# Patient Record
Sex: Male | Born: 1980
Health system: Southern US, Community
[De-identification: ages and names within clinical notes are randomized; demographics above are authoritative.]

## PROBLEM LIST (undated history)

## (undated) DIAGNOSIS — K219 Gastro-esophageal reflux disease without esophagitis: Secondary | ICD-10-CM

## (undated) DIAGNOSIS — R001 Bradycardia, unspecified: Secondary | ICD-10-CM

## (undated) HISTORY — DX: Gastro-esophageal reflux disease without esophagitis: K21.9

## (undated) HISTORY — PX: NO PAST SURGERIES: SHX2092

---

## 2008-08-16 ENCOUNTER — Ambulatory Visit: Payer: Self-pay | Admitting: Family Medicine

## 2008-08-16 DIAGNOSIS — J309 Allergic rhinitis, unspecified: Secondary | ICD-10-CM | POA: Insufficient documentation

## 2008-08-16 LAB — CONVERTED CEMR LAB
ALT: 20 units/L (ref 0–53)
AST: 25 units/L (ref 0–37)
Albumin: 4.6 g/dL (ref 3.5–5.2)
BUN: 9 mg/dL (ref 6–23)
Basophils Relative: 0.7 % (ref 0.0–3.0)
Chloride: 103 meq/L (ref 96–112)
Creatinine, Ser: 1 mg/dL (ref 0.4–1.5)
Eosinophils Absolute: 0.2 10*3/uL (ref 0.0–0.7)
Eosinophils Relative: 3.4 % (ref 0.0–5.0)
GFR calc non Af Amer: 95 mL/min
Glucose, Bld: 100 mg/dL — ABNORMAL HIGH (ref 70–99)
HCT: 46 % (ref 39.0–52.0)
MCV: 95.3 fL (ref 78.0–100.0)
Neutrophils Relative %: 49.6 % (ref 43.0–77.0)
RBC: 4.82 M/uL (ref 4.22–5.81)
TSH: 2 microintl units/mL (ref 0.35–5.50)
Total Protein: 7.5 g/dL (ref 6.0–8.3)
VLDL: 24 mg/dL (ref 0–40)
WBC: 6.1 10*3/uL (ref 4.5–10.5)

## 2008-08-17 ENCOUNTER — Ambulatory Visit: Payer: Self-pay | Admitting: Family Medicine

## 2008-08-17 ENCOUNTER — Telehealth (INDEPENDENT_AMBULATORY_CARE_PROVIDER_SITE_OTHER): Payer: Self-pay | Admitting: *Deleted

## 2008-08-24 ENCOUNTER — Telehealth (INDEPENDENT_AMBULATORY_CARE_PROVIDER_SITE_OTHER): Payer: Self-pay | Admitting: *Deleted

## 2008-08-24 ENCOUNTER — Encounter (INDEPENDENT_AMBULATORY_CARE_PROVIDER_SITE_OTHER): Payer: Self-pay | Admitting: *Deleted

## 2009-01-24 ENCOUNTER — Ambulatory Visit: Payer: Self-pay | Admitting: Family Medicine

## 2009-01-28 ENCOUNTER — Encounter (INDEPENDENT_AMBULATORY_CARE_PROVIDER_SITE_OTHER): Payer: Self-pay | Admitting: *Deleted

## 2009-02-11 ENCOUNTER — Encounter: Payer: Self-pay | Admitting: Internal Medicine

## 2009-02-11 ENCOUNTER — Ambulatory Visit: Payer: Self-pay | Admitting: Family Medicine

## 2009-02-11 DIAGNOSIS — R5381 Other malaise: Secondary | ICD-10-CM | POA: Insufficient documentation

## 2009-02-11 DIAGNOSIS — I498 Other specified cardiac arrhythmias: Secondary | ICD-10-CM | POA: Insufficient documentation

## 2009-02-11 DIAGNOSIS — R5383 Other fatigue: Secondary | ICD-10-CM

## 2009-02-12 ENCOUNTER — Encounter (INDEPENDENT_AMBULATORY_CARE_PROVIDER_SITE_OTHER): Payer: Self-pay | Admitting: *Deleted

## 2009-02-12 LAB — CONVERTED CEMR LAB
BUN: 12 mg/dL (ref 6–23)
Basophils Absolute: 0 10*3/uL (ref 0.0–0.1)
Calcium: 9 mg/dL (ref 8.4–10.5)
Creatinine, Ser: 0.8 mg/dL (ref 0.4–1.5)
Eosinophils Absolute: 0.2 10*3/uL (ref 0.0–0.7)
GFR calc non Af Amer: 121.82 mL/min (ref >60)
Glucose, Bld: 91 mg/dL (ref 70–99)
Lymphocytes Relative: 44.2 % (ref 12.0–46.0)
MCHC: 35.1 g/dL (ref 30.0–36.0)
Monocytes Relative: 11.3 % (ref 3.0–12.0)
Neutrophils Relative %: 41.3 % — ABNORMAL LOW (ref 43.0–77.0)
Platelets: 240 10*3/uL (ref 150.0–400.0)
RDW: 11.5 % (ref 11.5–14.6)
Sodium: 144 meq/L (ref 135–145)
TSH: 2.61 microintl units/mL (ref 0.35–5.50)

## 2009-03-01 ENCOUNTER — Ambulatory Visit: Payer: Self-pay | Admitting: Internal Medicine

## 2009-03-06 ENCOUNTER — Ambulatory Visit: Payer: Self-pay

## 2009-03-06 ENCOUNTER — Ambulatory Visit: Payer: Self-pay | Admitting: Internal Medicine

## 2010-06-05 ENCOUNTER — Ambulatory Visit: Payer: Self-pay | Admitting: Family Medicine

## 2010-06-05 DIAGNOSIS — R109 Unspecified abdominal pain: Secondary | ICD-10-CM | POA: Insufficient documentation

## 2010-06-05 LAB — CONVERTED CEMR LAB
Hep B C IgM: NEGATIVE
Hep B Core Total Ab: POSITIVE — AB

## 2010-06-06 ENCOUNTER — Encounter: Payer: Self-pay | Admitting: Family Medicine

## 2010-06-09 ENCOUNTER — Telehealth (INDEPENDENT_AMBULATORY_CARE_PROVIDER_SITE_OTHER): Payer: Self-pay | Admitting: *Deleted

## 2010-06-09 LAB — CONVERTED CEMR LAB
ALT: 21 units/L (ref 0–53)
BUN: 13 mg/dL (ref 6–23)
Basophils Absolute: 0 10*3/uL (ref 0.0–0.1)
Bilirubin, Direct: 0.2 mg/dL (ref 0.0–0.3)
Chloride: 105 meq/L (ref 96–112)
Creatinine, Ser: 0.8 mg/dL (ref 0.4–1.5)
Eosinophils Relative: 0.9 % (ref 0.0–5.0)
GFR calc non Af Amer: 122.48 mL/min (ref >60)
Glucose, Bld: 126 mg/dL — ABNORMAL HIGH (ref 70–99)
H Pylori IgG: POSITIVE
HCT: 43.7 % (ref 39.0–52.0)
Lymphs Abs: 1.3 10*3/uL (ref 0.7–4.0)
MCV: 92.9 fL (ref 78.0–100.0)
Monocytes Absolute: 0.3 10*3/uL (ref 0.1–1.0)
Neutrophils Relative %: 31.5 % — ABNORMAL LOW (ref 43.0–77.0)
Platelets: 284 10*3/uL (ref 150.0–400.0)
RDW: 12.3 % (ref 11.5–14.6)
Total Bilirubin: 0.9 mg/dL (ref 0.3–1.2)
WBC: 2.5 10*3/uL — ABNORMAL LOW (ref 4.5–10.5)

## 2010-06-16 DIAGNOSIS — D709 Neutropenia, unspecified: Secondary | ICD-10-CM | POA: Insufficient documentation

## 2010-06-16 DIAGNOSIS — K921 Melena: Secondary | ICD-10-CM | POA: Insufficient documentation

## 2010-06-17 ENCOUNTER — Ambulatory Visit: Payer: Self-pay | Admitting: Family Medicine

## 2010-06-17 LAB — CONVERTED CEMR LAB
Basophils Absolute: 0 10*3/uL (ref 0.0–0.1)
Eosinophils Relative: 2.5 % (ref 0.0–5.0)
Monocytes Absolute: 0.7 10*3/uL (ref 0.1–1.0)
Monocytes Relative: 15.1 % — ABNORMAL HIGH (ref 3.0–12.0)
Neutrophils Relative %: 41.3 % — ABNORMAL LOW (ref 43.0–77.0)
Platelets: 237 10*3/uL (ref 150.0–400.0)
RDW: 12.5 % (ref 11.5–14.6)
WBC: 4.9 10*3/uL (ref 4.5–10.5)

## 2010-07-22 ENCOUNTER — Telehealth (INDEPENDENT_AMBULATORY_CARE_PROVIDER_SITE_OTHER): Payer: Self-pay | Admitting: *Deleted

## 2010-08-05 NOTE — Assessment & Plan Note (Signed)
Summary: FOR STOMACH PROBLEM//PH   Vital Signs:  Patient profile:   30 year old male Height:      61.50 inches Weight:      121 pounds BMI:     22.57 Temp:     98.8 degrees F oral Pulse rate:   64 / minute BP sitting:   112 / 70  (left arm)  Vitals Entered By: Doristine Devoid CMA (June 05, 2010 2:53 PM) CC: stomach cramping mostly in am hx of tapeworm    History of Present Illness: 30 yo man here today for abd cramping.  sxs are intermittant.  hx of tapeworm in the past (when living in [place])- pt feels this is similar to previous.  no diarrhea.  no vomiting.  sxs have been present for 'years'.  sxs are not worsening.  no pain currently.  no weight loss.  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 Lungs are clear to auscultation, no crackles or wheezes. Heart:  Normal rate and regular rhythm. S1 and S2 normal without gallop, murmur, click, rub or other extra sounds. Abdomen:  Bowel sounds positive,abdomen soft and non-tender without masses, organomegaly or hernias noted.   Impression & Recommendations:  Problem # 1:  ABDOMINAL PAIN (ICD-789.00) Assessment New again difficult hx to obtain given language barrier.  pt and girlfriend are most worried about recurrent tapeworm and hepatitis status.  pt has no idea if he has been vaccinated or not.  will check labs.  advised H2 blocker for sxs relief- if no improvement will change to PPI. Orders: Venipuncture (45409) TLB-BMP (Basic Metabolic Panel-BMET)  (80048-METABOL) TLB-CBC Platelet - w/Differential (85025-CBCD) TLB-Hepatic/Liver Function Pnl (80076-HEPATIC) TLB-H. Pylori Abs(Helicobacter Pylori) (86677-HELICO) T-Hepatitis B Core Antibody, IGM (81191-47829) T-Hepatitis B Core Antibody (56213-08657) T-Hepatitis B Surface Antigen (84696-29528) T-Hepatitis B Surface Antibody (41324-40102) T-Stool for O&P (72536-64403) T-Culture, Stool (87045/87046-70140) Specimen Handling (47425)  Patient Instructions: 1)  We'll notify you of your lab results 2)  Please collect the stool sample and return it as directed 3)  Make sure you are eating regularly to avoid your stomach being empty 4)  Start Zantac to decrease amount of stomach acid 5)  Call with any questions or concerns 6)  Hang in there!   Orders Added: 1)  Venipuncture [36415] 2)  TLB-BMP (Basic Metabolic Panel-BMET) [80048-METABOL] 3)  TLB-CBC Platelet - w/Differential [85025-CBCD] 4)  TLB-Hepatic/Liver Function Pnl [80076-HEPATIC] 5)  TLB-H. Pylori Abs(Helicobacter Pylori) [86677-HELICO] 6)  T-Hepatitis B Core Antibody, IGM [86705-23580] 7)  T-Hepatitis B Core Antibody [95638-75643] 8)  T-Hepatitis B Surface Antigen [32951-88416] 9)  T-Hepatitis B Surface Antibody [60630-16010] 10)  T-Stool for O&P [87177-70555] 11)  T-Culture, Stool [87045/87046-70140] 12)  Specimen Handling [99000] 13)  Est. Patient Level III [93235]

## 2010-08-05 NOTE — Progress Notes (Signed)
Summary: labs   Phone Note Outgoing Call   Call placed by: Doristine Devoid CMA,  June 09, 2010 3:14 PM Call placed to: Patient Summary of Call: H pylori +.  needs to start bismuth subsalicylate 525mg  three times a day + metronidazole 500mg  three times a day + tetracycline 500mg  three times a day + omeprazole 40mg  daily x 14 days.  also has low WBC.  needs repeat CBC in 1 week- dx neutropenia.   Follow-up for Phone Call        spoke w/ Thu patient contact informed of labs and treatment information will call back to scheduled labs in 1 week..........Marland KitchenDoristine Devoid CMA  June 09, 2010 3:15 PM     New/Updated Medications: METRONIDAZOLE 500 MG TABS (METRONIDAZOLE) take one tablet three times a day x14 days TETRACYCLINE HCL 500 MG CAPS (TETRACYCLINE HCL) take one tablet three times a day x14 days OMEPRAZOLE 40 MG CPDR (OMEPRAZOLE) take one tablet daily x14 days Prescriptions: OMEPRAZOLE 40 MG CPDR (OMEPRAZOLE) take one tablet daily x14 days  #14 x 0   Entered by:   Doristine Devoid CMA   Authorized by:   Neena Rhymes MD   Signed by:   Doristine Devoid CMA on 06/09/2010   Method used:   Electronically to        [id number] (retail)       [address]       [place], [place]  [address]       Ph: [phone] or [phone]       Fax: [phone]   RxID:   [id number] TETRACYCLINE HCL 500 MG CAPS (TETRACYCLINE HCL) take one tablet three times a day x14 days  #42 x 0   Entered by:   Doristine Devoid CMA   Authorized by:   Neena Rhymes MD   Signed by:   Doristine Devoid CMA on 06/09/2010   Method used:   Electronically to        [id number]* (retail)       [address]       [place], [place]  [address]       Ph: [phone] or [phone]       Fax: [phone]   RxID:   [id number] METRONIDAZOLE 500 MG TABS (METRONIDAZOLE) take one tablet three times a day x14 days  #42 x 0   Entered by:    Doristine Devoid CMA   Authorized by:   Neena Rhymes MD   Signed by:   Doristine Devoid CMA on 06/09/2010   Method used:   Electronically to        [id number] (retail)       [address]       [place], [place]  [address]       Ph: [phone] or [phone]       Fax: [phone]   RxID:   [id number]

## 2010-08-07 NOTE — Assessment & Plan Note (Signed)
Summary: blood in stool, weightloss, LABS:CBC//FD   Vital Signs:  Patient profile:   30 year old male Weight:      121 pounds BMI:     22.57 Temp:     97.8 degrees F oral BP sitting:   100 / 70  (left arm)  Vitals Entered By: Doristine Devoid CMA (June 16, 2010 2:11 PM) CC: review labs had blood in stool    History of Present Illness: 30 yo man here today for  1) BRBPR- pt reports intermitant sxs for 'a few years'.  last occurred 2-3 months ago.  there was no straining involved.  no hx of constipation.  pt told girlfriend that he thought he had been dx'd w/ internal hemorrhoid previously.  no pain w/ bleeding.  2) hepatitis- discussed pt's immunity due to prior infxn.  3) Neutropenia- WBC of 2.5 is much lower than previous value in the 6s.  will repeat CBC.  Current Medications (verified): 1)  Metronidazole 500 Mg Tabs (Metronidazole) .... Take One Tablet Three Times A Day X14 Days 2)  Tetracycline Hcl 500 Mg Caps (Tetracycline Hcl) .... Take One Tablet Three Times A Day X14 Days 3)  Omeprazole 40 Mg Cpdr (Omeprazole) .... Take One Tablet Daily X14 Days  Allergies (verified): No Known Drug Allergies  Past History:  Past medical, surgical, family and social histories (including risk factors) reviewed, and no changes noted (except as noted below).  Past Medical History: + TB test Fatigue Allergic rhinitis Hep B immunity from previous infxn  Past Surgical History: Reviewed history from 03/01/2009 and no changes required. wisdom teeth extraction  Family History: Reviewed history from 08/16/2008 and no changes required. CAD-no HTN-no DM-no STROKE-no COLON CA-no PROSTATE CA-no  Social History: Reviewed history from 06/05/2010 and no changes required. 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 No apparent alopecia or balding.  Neck:  No deformities, masses, or tenderness noted. Abdomen:  Bowel sounds positive,abdomen soft and non-tender without masses, organomegaly or hernias noted. Rectal:  pt declined Skin:  Intact without suspicious lesions or rashes Cervical Nodes:  No lymphadenopathy noted Psych:  anxious   Impression & Recommendations:  Problem # 1:  BLOOD IN STOOL (ICD-578.1) Assessment New will refer to GI but pt needs to check w/ insurance prior to refferal to make sure that GI is preferred provider.  pt declined rectal exam today.  Problem # 2:  NEUTROPENIA UNSPECIFIED (ICD-288.00) Assessment: New recheck CBC.  if remains low will refer to hematology Orders: Venipuncture (62130) Specimen Handling (86578) TLB-CBC Platelet - w/Differential (85025-CBCD)  Problem # 3:  HEPATITIS B IMMUNITY (ICD-V05.8) Assessment: New discussed pt's hep b panel at length to make sure pt and girlfriend understood that he does have immunity from prior infxn.  Complete Medication List: 1)  Metronidazole 500 Mg Tabs (Metronidazole) .... Take one tablet three times a day x14 days 2)  Tetracycline Hcl 500 Mg Caps (Tetracycline hcl) .... Take one tablet three times a day x14 days 3)  Omeprazole 40 Mg Cpdr (Omeprazole) .... Take one tablet daily x14 days  Patient Instructions: 1)  Please call and let us know where you want to go to GI for the blood  in the stool 2)  We'll notify you of your lab results 3)  You are immune to hepatitis due to past infection 4)  Call with any questions or concerns 5)  Happy Holidays!   Orders Added: 1)  Venipuncture [84132] 2)  Specimen Handling [99000] 3)  TLB-CBC Platelet - w/Differential [85025-CBCD] 4)  Est. Patient Level IV [44010]

## 2010-08-07 NOTE — Progress Notes (Signed)
Summary: referral  Phone Note Call from Patient Call back at [phone]   Summary of Call: Pt will be seeing Dr. Theron Arista @ [facility]. Pt appt 08-04-10. orders need to be seen to fax [phone]...Marland KitchenMarland KitchenFelecia Deloach CMA  July 22, 2010 3:37 PM   Left detail message OV notes and labs faxed to Dr Theron Arista @[email or web address]............Marland KitchenFelecia Deloach CMA  July 22, 2010 3:44 PM

## 2012-03-10 ENCOUNTER — Ambulatory Visit: Payer: Self-pay | Admitting: Family Medicine

## 2014-05-24 ENCOUNTER — Encounter: Payer: Self-pay | Admitting: Family Medicine

## 2014-05-24 ENCOUNTER — Ambulatory Visit (INDEPENDENT_AMBULATORY_CARE_PROVIDER_SITE_OTHER): Payer: BC Managed Care – PPO | Admitting: Family Medicine

## 2014-05-24 VITALS — BP 120/80 | HR 57 | Temp 97.9°F | Resp 16 | Wt 117.0 lb

## 2014-05-24 DIAGNOSIS — M5412 Radiculopathy, cervical region: Secondary | ICD-10-CM | POA: Insufficient documentation

## 2014-05-24 MED ORDER — PREDNISONE 10 MG PO TABS: ORAL_TABLET | ORAL | Status: DC

## 2014-05-24 MED ORDER — CYCLOBENZAPRINE HCL 10 MG PO TABS: 10.0000 mg | ORAL_TABLET | Freq: Three times a day (TID) | ORAL | Status: DC | PRN

## 2014-05-24 NOTE — Progress Notes (Signed)
   Subjective:    Patient ID: Paul Cabrera, male    DOB: 10/13/1980, 33 y.o.   MRN: [id number]  HPI Neck pain- pt was lifting something heavy ~4 weeks ago.  Marthe PatchHeard 'pop'.  Now having neck pain, thoracic back pain radiating into lumbar.  Pain will radiate into both shoulders and intermittent numbness of both hands.  Taking Advil w/o relief.   Review of Systems For ROS see HPI     Objective:   Physical Exam  Constitutional: He is oriented to person, place, and time. He appears well-developed and well-nourished. No distress.  HENT:  Head: Normocephalic and atraumatic.  Neck: Normal range of motion. Neck supple.  + trap spasm bilaterally  Musculoskeletal:  Full ROM of shoulders bilaterally  Neurological: He is alert and oriented to person, place, and time. He has normal reflexes. No cranial nerve deficit. Coordination normal.  Skin: Skin is warm and dry.  Psychiatric: He has a normal mood and affect. His behavior is normal. Thought content normal.  Vitals reviewed.         Assessment & Plan:

## 2014-05-24 NOTE — Progress Notes (Signed)
Pre visit review using our clinic review tool, if applicable. No additional management support is needed unless otherwise documented below in the visit note. 

## 2014-05-24 NOTE — Patient Instructions (Signed)
Follow up as needed Start the Prednisone as directed- take w/ food- for inflammation Use the flexeril nightly for muscle spasm HEAT!! If no improvement in 10 days, call me so we can refer to ortho for evaluation Call with any questions or concerns Hang in there!!!

## 2014-05-27 NOTE — Assessment & Plan Note (Signed)
New.  Pt's sxs and PE consistent w/ muscle spasm causing radicular pain.  He may have cervical disc injury/disease but no TTP over cervical spine.  Start pred taper.  Flexeril prn spasms.  If no improvement, will need ortho referral.  Reviewed supportive care and red flags that should prompt return.  Pt expressed understanding and is in agreement w/ plan.

## 2014-08-11 ENCOUNTER — Ambulatory Visit (INDEPENDENT_AMBULATORY_CARE_PROVIDER_SITE_OTHER): Payer: BLUE CROSS/BLUE SHIELD | Admitting: Internal Medicine

## 2014-08-11 VITALS — BP 100/60 | HR 46 | Temp 98.6°F | Resp 16 | Ht 63.0 in | Wt 122.0 lb

## 2014-08-11 DIAGNOSIS — L259 Unspecified contact dermatitis, unspecified cause: Secondary | ICD-10-CM

## 2014-08-11 MED ORDER — PREDNISONE 10 MG PO TABS: ORAL_TABLET | ORAL | Status: DC

## 2014-08-11 NOTE — Patient Instructions (Signed)
Vim Da Ti?p Xc (Contact Dermatitis) Vim da ti?p xc l ph?n ?ng v?i cc ch?t nh?t ??nh ti?p xc v?i da. Vim da ti?p xc c th? l vim da do ti?p xc v?i ch?t gy kch thch ho?c vim da ti?p xc do d? ?ng. Vim da do ti?p xc v?i ch?t gy kch thch khng c?n ti?p xc tr??c ? v?i ch?t gy ra ph?n ?ng. Vim da ti?p xc do d? ?ng ch? x?y ra n?u b?n ? ti?p xc v?i ch?t ? tr??c ?y. Khi ti?p xc l?p l?i, c? th? c?a b?n s? ph?n ?ng v?i ch?t ?. NGUYN NHN Nhi?u ch?t c th? gy ra vim da ti?p xc. Vim da do ch?t kch thch l nguyn nhn ph? bi?n nh?t do ti?p xc l?p ?i l?p l?i v?i ch?t kch thch nh?, ch?ng h?n nh?:  Trang ?i?m.  X phng.  Ch?t t?y r?a.  Ch?t t?y tr?ng.  Axit.  Mu?i kim lo?i, ch?ng h?n nh? niken. Vim da ti?p xc do d? ?ng ph? bi?n nh?t l do ti?p xc v?i:  Th?c v?t ??c.  Ha ch?t (ch?t kh? mi, d?u g?i).  ?? trang s?c.  Bao cao su.  Neomycin trong kem khng sinh ba tc d?ng.  Ch?t b?o qu?n trong cc s?n ph?m, bao g?m c? qu?n o. TRI?U CH?NG Vng da b? ti?p xc c th? pht tri?n:  Kh ho?c bong v?y.  ??.  N?t.  Ng?a.  ?au ho?c c?m gic b?ng rt.  M?n n??c. Khi b? vim da ti?p xc do d? ?ng, c?ng c th? b? s?ng trong nh?ng vng nh? m m?t, mi?ng ho?c b? ph?n sinh d?c. CH?N ?ON Chuyn gia ch?m sc s?c kh?e c?a b?n th??ng c th? xc ??nh v?n ?? b?ng cch khm th?c th?. Trong tr??ng h?p khng ch?c ch?n nguyn nhn v nghi ng? b?nh vim da ti?p xc do d? ?ng, xt nghi?m mi?ng da c th? ???c th?c hi?n ?? gip xc ??nh nguyn nhn vim da. ?I?U TR? ?i?u tr? bao g?m b?o v? da kh?i ti?p xc thm v?i ch?t kch thch b?ng cch trnh cc ch?t ? n?u c th?. B?t, kem ch?n v g?ng tay c th? h?u ch. Chuyn gia ch?m sc s?c kh?e c?a b?n c?ng c th? ?? ngh?:  Thoa kem ho?c thu?c m? steroid 2 l?n m?i ngy. ?? c k?t qu? t?t nh?t, hy ngm vng pht ban vo n??c l?nh trong 20 pht. Sau ? thoa thu?c. Che vng ny b?ng mi?ng nh?a. B?n c th? b?o qu?n kem  steroid trong t? l?nh ?? c hi?u ?ng "l?nh" cho ch? pht ban c?a b?n. Cch ? c th? lm gi?m ng?a. Thu?c steroid dng ?? u?ng c th? c?n thi?t trong tr??ng h?p n?ng.  Thu?c khng sinh ho?c thu?c m? khng khu?n n?u xu?t hi?n nhi?m trng da.  Dung d?ch thu?c khng histamin ho?c thu?c khng histamin d?ng u?ng ?? gi?m b?t ng?a.  D?u ?? gi? ?? ?m trong da.  Dung d?ch burow ?? gi?m t?y ?? v ?au nh?c ho?c ?? lm kh ban ?ang r? n??c. Tr?n m?t gi ho?c vin dung d?ch trong 2 chn n??c l?nh. Nhng m?t chi?c kh?n s?ch vo h?n h?p, v?t m?t cht v ??t n ln vng b? ?nh h??ng. ?? kh?n t?i ch? trong 30 pht. Lm ?i?u ny cng nhi?u l?n cng t?t trong su?t c? ngy.  T?m b?t b?p ho?c so?a bicacbonat hng ngy n?u vng ny l qu l?n ?? che b?ng kh?n m?t. Ha ch?t m?nh,   ch?ng h?n nh? ch?t ki?m ho?c axit, c th? gy t?n th??ng da gi?ng nh? b?ng. B?n c?n x? n??c vo da mnh t? 15 ??n 20 pht b?ng n??c l?nh sau khi ti?p xc. B?n c?ng nn ?i khm ngay l?p t?c sau khi ti?p xc. B?ng, thu?c khng sinh v thu?c gi?m ?au c th? c?n thi?t cho da b? kch thch n?ng. H??NG D?N CH?M SC T?I NH  Trnh ch?t gy ra ph?n ?ng.  Gi? cho vng da b? ?nh h??ng trnh xa n??c nng, x phng, nh sng m?t tr?i, ha ch?t, cc ch?t c tnh axit ho?c b?t c? th? g khc c th? gy kch ?ng da c?a b?n.  Khng gi ch? pht ban. Gi c th? khi?n cho ch? pht ban b? nhi?m trng.  B?n c th? t?m n??c mt ?? gip gi?m ng?a.  Ch? s? d?ng thu?c khng c?n k toa ho?c thu?c c?n k toa theo ch? d?n c?a chuyn gia ch?m sc s?c kh?e.  G?p chuyn gia ch?m sc s?c kh?e c?a b?n ?? khm l?i theo ch? d?n ?? ??m b?o da c?a b?n li?n ?ng cch. HY ?I KHM N?U:  Tnh tr?ng c?a b?n khng c?i thi?n sau 3 ngy ?i?u tr?.  B?n d??ng nh? tr? nn t? h?n.  B?n th?y c d?u hi?u nhi?m trng nh? s?ng, nh?y c?m ?au, ??, ?au nh?c ho?c ?m ? vng b? ?nh h??ng.  B?n c b?t k? v?n ?? no lin quan ??n thu?c. Document Released: 04/01/2005 Document Revised:  02/22/2013 Exit[employer] Patient Information 2015 [employer]. This information is not intended to replace advice given to you by your health care provider. Make sure you discuss any questions you have with your health care provider.  

## 2014-08-11 NOTE — Progress Notes (Signed)
Subjective:  This chart was scribed for Paul Siaobert Merelyn Klump, MD by Paul Cabrera, ED Scribe at [facility].The patient was seen in exam room 01 and the patient's care was started at 2:15 PM.   Patient ID: Paul Cabrera, male    DOB: 04/08/1981, 34 y.o.   MRN: [id number] Chief Complaint  Patient presents with  . Rash    x 2 days   HPI  HPI Comments: Paul Cabrera is a 34 y.o. male [facility] of a generalized erythematous rash, onset  2 days. It is located throughout his body on both forearms and chest. He was working outside around trees and leaves. He believes he may have poison ivy. Pt states the rash does itch. His skin is dry around the rash.   Patient Active Problem List   Diagnosis Date Noted  . Cervical radicular pain 05/24/2014  . NEUTROPENIA UNSPECIFIED 06/16/2010  . BLOOD IN STOOL 06/16/2010  . ABDOMINAL PAIN 06/05/2010  . BRADYCARDIA 02/11/2009  . FATIGUE 02/11/2009  . RHINITIS 08/16/2008  . POSITIVE PPD 08/16/2008   History reviewed. No pertinent past medical history. History reviewed. No pertinent past surgical history. No Known Allergies Prior to Admission medications   Medication Sig Start Date End Date Taking? Authorizing Provider  cyclobenzaprine (FLEXERIL) 10 MG tablet Take 1 tablet (10 mg total) by mouth 3 (three) times daily as needed for muscle spasms. 05/24/14  Yes Sheliah HatchKatherine E Tabori, MD   History   Social History  . Marital Status: Single    Spouse Name: N/A    Number of Children: N/A  . Years of Education: N/A   Occupational History  . Not on file.   Social History Main Topics  . Smoking status: Never Smoker   . Smokeless tobacco: Not on file  . Alcohol Use: Not on file  . Drug Use: Not on file  . Sexual Activity: Not on file   Other Topics Concern  . Not on file   Social History Narrative   Review of Systems  Skin: Positive for rash.      Objective:  BP 100/60 mmHg  Pulse 46  Temp(Src) 98.6 F (37  C) (Oral)  Resp 16  Ht 5\' 3"  (1.6 m)  Wt 122 lb (55.339 kg)  BMI 21.62 kg/m2  SpO2 99%  Physical Exam  Constitutional: He is oriented to person, place, and time. He appears well-developed and well-nourished. No distress.  HENT:  Head: Normocephalic and atraumatic.  Eyes: Pupils are equal, round, and reactive to light.  Neck: Normal range of motion.  Cardiovascular: Normal rate and regular rhythm.   Pulmonary/Chest: Effort normal. No respiratory distress.  Musculoskeletal: Normal range of motion.  Neurological: He is alert and oriented to person, place, and time.  Skin: Skin is warm and dry.  erythematous papular vesicular rash on his forearms and chest.  Psychiatric: He has a normal mood and affect. His behavior is normal.  Nursing note and vitals reviewed.         Assessment & Plan:  Contact dermatitis  Meds ordered this encounter  Medications  . predniSONE (DELTASONE) 10 MG tablet    Sig: 3 tabs x3 days and then 2 tabs x3 days and then 1 tab x3 days.  Take w/ food.    Dispense:  18 tablet    Refill:  0      I have completed the patient encounter in its entirety as documented by the scribe, with editing by  me where necessary. Riane Rung P. Laney Pastor, M.D.

## 2015-12-09 ENCOUNTER — Encounter (HOSPITAL_COMMUNITY): Payer: Self-pay | Admitting: Emergency Medicine

## 2015-12-09 ENCOUNTER — Emergency Department (HOSPITAL_COMMUNITY)
Admission: EM | Admit: 2015-12-09 | Discharge: 2015-12-09 | Disposition: A | Discharge status: Home / Self Care | Payer: BLUE CROSS/BLUE SHIELD | Attending: Emergency Medicine | Admitting: Emergency Medicine

## 2015-12-09 ENCOUNTER — Emergency Department (HOSPITAL_COMMUNITY): Payer: BLUE CROSS/BLUE SHIELD

## 2015-12-09 DIAGNOSIS — R51 Headache: Secondary | ICD-10-CM | POA: Insufficient documentation

## 2015-12-09 DIAGNOSIS — R519 Headache, unspecified: Secondary | ICD-10-CM

## 2015-12-09 HISTORY — DX: Bradycardia, unspecified: R00.1

## 2015-12-09 MED ORDER — SODIUM CHLORIDE 0.9 % IV BOLUS (SEPSIS)
1000.0000 mL | Freq: Once | INTRAVENOUS | Status: AC
  Administered 2015-12-09: 1000 mL via INTRAVENOUS

## 2015-12-09 MED ORDER — DIPHENHYDRAMINE HCL 50 MG/ML IJ SOLN
25.0000 mg | Freq: Once | INTRAMUSCULAR | Status: AC
  Administered 2015-12-09: 25 mg via INTRAVENOUS
  Filled 2015-12-09: qty 1

## 2015-12-09 MED ORDER — IBUPROFEN 200 MG PO TABS
600.0000 mg | ORAL_TABLET | Freq: Once | ORAL | Status: AC
  Administered 2015-12-09: 600 mg via ORAL
  Filled 2015-12-09: qty 3

## 2015-12-09 MED ORDER — PROCHLORPERAZINE EDISYLATE 5 MG/ML IJ SOLN
10.0000 mg | Freq: Once | INTRAMUSCULAR | Status: AC
  Administered 2015-12-09: 10 mg via INTRAVENOUS
  Filled 2015-12-09: qty 2

## 2015-12-09 MED ORDER — LORAZEPAM 2 MG/ML IJ SOLN
1.0000 mg | Freq: Once | INTRAMUSCULAR | Status: DC
  Filled 2015-12-09: qty 1

## 2015-12-09 NOTE — ED Notes (Addendum)
Patient reports they spoke with MD and states he does not need ativan anymore due to no longer shaking.  Ativan held.

## 2015-12-09 NOTE — ED Provider Notes (Signed)
CSN: [id number]     Arrival date & time 12/09/15  0203 History   First MD Initiated Contact with Patient 12/09/15 [phone]     Chief Complaint  Patient presents with  . Headache     (Consider location/radiation/quality/duration/timing/severity/associated sxs/prior Treatment) HPI  This is a 35 year old male with a history of headaches who presents with headache. Patient reports history of chronic headache but had onset of a "new headache" last night. Onset of symptoms was approximately 9 PM. He reports fairly acute onset of stabbing posterior headache. Pain is 10 out of 10. Reports he has never had a headache like this before. He reports photophobia. No vision changes, weakness, numbness. No fevers or neck stiffness. He is taking 2 Tylenol with minimal relief. Denies nausea or vomiting. Does report increased stress at home.  Patient speaks primarily [place].  His wife is at the bedside. She is an NP in family practice and speaks good [place]. She provides most of the translation.  Past Medical History  Diagnosis Date  . Bradycardia    History reviewed. No pertinent past surgical history. No family history on file. Social History  Substance Use Topics  . Smoking status: Never Smoker   . Smokeless tobacco: None  . Alcohol Use: No    Review of Systems  Constitutional: Negative for fever.  Eyes: Positive for photophobia. Negative for visual disturbance.  Gastrointestinal: Negative for nausea and vomiting.  Musculoskeletal: Negative for neck pain and neck stiffness.  Neurological: Positive for headaches. Negative for speech difficulty, weakness and numbness.  All other systems reviewed and are negative.     Allergies  Review of patient's allergies indicates no known allergies.  Home Medications   Prior to Admission medications   Medication Sig Start Date End Date Taking? Authorizing Provider  acetaminophen (TYLENOL) 500 MG tablet Take 1,000 mg by mouth every 6 (six) hours as  needed for headache.   Yes Historical Provider, MD  cyclobenzaprine (FLEXERIL) 10 MG tablet Take 1 tablet (10 mg total) by mouth 3 (three) times daily as needed for muscle spasms. 05/24/14   Sheliah HatchKatherine E Tabori, MD  predniSONE (DELTASONE) 10 MG tablet 3 tabs x3 days and then 2 tabs x3 days and then 1 tab x3 days.  Take w/ food. 08/11/14   Tonye Pearsonobert P Doolittle, MD   BP 131/95 mmHg  Pulse 50  Temp(Src) 97.3 F (36.3 C) (Oral)  Resp 15  SpO2 99% Physical Exam  Constitutional: He is oriented to person, place, and time. He appears well-developed and well-nourished. No distress.  HENT:  Head: Normocephalic and atraumatic.  Cardiovascular: Normal rate, regular rhythm and normal heart sounds.   No murmur heard. Pulmonary/Chest: Effort normal and breath sounds normal. No respiratory distress. He has no wheezes.  Abdominal: Soft. Bowel sounds are normal. There is no tenderness. There is no rebound.  Musculoskeletal: He exhibits no edema.  Neurological: He is alert and oriented to person, place, and time.  Cranial nerves II through XII intact, 5 out of 5 strength in all 4 extremities, no dysmetria to finger-nose-finger  Skin: Skin is warm and dry.  Psychiatric: He has a normal mood and affect.  Nursing note and vitals reviewed.   ED Course  Procedures (including critical care time) Labs Review Labs Reviewed - No data to display  Imaging Review Ct Head Wo Contrast  12/09/2015  CLINICAL DATA:  Initial evaluation for acute occipital headache. EXAM: CT HEAD WITHOUT CONTRAST TECHNIQUE: Contiguous axial images were obtained from the base of the  skull through the vertex without intravenous contrast. COMPARISON:  None. FINDINGS: There is no acute intracranial hemorrhage or infarct. No mass lesion or midline shift. Gray-white matter differentiation is well maintained. Ventricles are normal in size without evidence of hydrocephalus. CSF containing spaces are within normal limits. No extra-axial fluid  collection. The calvarium is intact. Orbital soft tissues are within normal limits. The paranasal sinuses and mastoid air cells are well pneumatized and free of fluid. Scalp soft tissues are unremarkable. IMPRESSION: Normal head CT.  No acute intracranial process identified. Electronically Signed   By: Rise Mu M.D.   On: 12/09/2015 05:46   I have personally reviewed and evaluated these images and lab results as part of my medical decision-making.   EKG Interpretation None      MDM   Final diagnoses:  None    Patient presents with a headache. States onset approximately 7 hours after onset of symptoms. He is nonfocal on exam. Concerned that this is a different headache than normal. He appears in no acute distress. No infectious symptoms suggestive of meningitis. This could be a variation of his chronic headache. However, subarachnoid hemorrhage is also a consideration. Medications were ordered and a CT scan was obtained. CT scan was obtained between 7 and 8 hours after onset of symptoms but was negative. This is fairly sensitive but not as sensitive as less than 6 hours prior to onset of symptoms. Patient is scared of needles. He initially completely declined a migraine cocktail. I discussed with the patient and his wife my concerns regarding possible subarachnoid hemorrhage. At greater than 6 hours CT scan begins to diminish in sensitivity. Patient is refusing LP. I did convince him to have an IV placed for a migraine cocktail. On reassessment, he is sleeping comfortably. I again discussed at length with the wife the risk and benefits of foregoing the LP. She and her husband and stated understanding and would like to wait to see if the medications make him feel better.  Patient to be reassessed by Dr. Joni Fears.      Shon Baton, MD 12/09/15 [phone]

## 2015-12-09 NOTE — Discharge Instructions (Signed)
The CT scan of your head does not show bleeding or tumor. However, it may miss some bleeding. YOu did not want a spinal tap to rule it out completely, but you need to return without fail for worsening symptoms, including confusion, vomiting and unable to keep down food/fluids, worsening pain, or any other symptoms concerning to you.  ?au n?a ??u. (Migraine Headache) ?au n?a ??u l m?t c?n ?au nhi v nhi?u ? m?t bn ??u c?a qu v?. M?t c?n ?au n?a ??u c th? ko di t? 30 pht ??n vi ti?ng. NGUYN NHN.  Nguyn nhn chnh xc c?a ?au n?a ??u khng ph?i lc no c?ng xc ??nh ???c. Tuy nhin, ?au n?a ??u c th? pht sinh khi cc dy th?n kinh trong no b? kch thch v gi?i phng ra cc ha ch?t gy vim. Hi?n t??ng ny gy ra ?au. M?t s? v?n ?? khc c?ng c th? gy ra ?au n?a ??u, ch?ng h?n:  R??u.  Ht thu?c l.  C?ng th?ng.  Kinh nguy?t.  Pho mt ?? lu.  Th?c ?n ho?c ?? u?ng c ch?a nitrat, glutamate, aspartame, ho?c tyramine.  Thi?u ng?.  S c la.  Caffeine.  ?i.  G?ng s?c.  M?t m?i.  Thu?c dng ?? ?i?u tr? ?au ng?c (nitroglycerine), vin thu?c trnh [place], estrogen, v m?t s? thu?c ?i?u tr? huy?t p. D?U HI?U V TRI?U CH?NG  ?au ? m?t bn ho?c c? hai bn ??u.  ?au t?ng c?n ho?c ?au nhi.  ?au nhi?u lm c?n tr? cc ho?t ??ng hng ngy.  C?n ?au k?ch pht khi c b?t k? ho?t ??ng th? ch?t no.  Bu?n nn, nn m?a, ho?c c? hai.  Chng m?t.  ?au khi ti?p xc v?i nh sng chi, ti?ng ?n l?n, ho?c ho?t ??ng.  Nh?y c?m ton thn v?i nh sng chi, ti?ng ?n l?n, ho?c mi. Tr??c khi qu v? b? ?au n?a ??u, qu v? c th? c nh?ng d?u hi?u c?nh bo s?p c c?n ?au n?a ??u (ti?n tri?u). M?t ti?n tri?u c th? bao g?m:  Nhn th?y nh sng lo ln.  Nhn th?y nh?ng ?i?m sng, qu?ng sng, ho?c cc ???ng ngo?n ngoo.  C th? tr??ng hnh ?ng ho?c nhn m?.  C c?m gic t b ho?c ?au bu?t.  Ni kh.  B? y?u c?. CH?N ?ON  ?au n?a ??u th??ng ???c ch?n ?on d?a  vo:  Cc tri?u ch?ng.  Khm th?c th?.  Ch?p CT ho?c MRI ??u qu v?. Cc ki?m tra b?ng hnh ?nh ny khng th? ch?n ?on ???c ?au n?a ??u, nh?ng chng c th? gip lo?i tr? nh?ng nguyn nhn gy ?au ??u khc. ?I?U TR? C th? cho dng thu?c gi?m ?au v ch?ng bu?n nn. C?ng c th? cho dng thu?c ?? ng?n ng?a ti di?n ?au n?a ??u.  H??NG D?N CH?M [place] T?I NH  Ch? s? d?ng thu?c khng c?n k ??n ho?c thu?c c?n k ??n ?? gi?m ?au ho?c gi?m c?m gic kh ch?u theo ch? d?n c?a chuyn gia ch?m [place] s?c kh?e c?a qu v?. Khng nn dng thu?c m gy nghi?n ko di.  N?m trong m?t phng t?i, yn t?nh khi qu v? b? ?au n?a ??u.  Ghi nh?t k hng ngy ?? tm ra ?i?u g c th? gy cc c?n ?au n?a ??u. Ch?ng h?n, hy ghi ra:  Qu v? ?n v u?ng g.  Qu v? ? ng? bao lu.  B?t k? thay ??i no trong ch? ?? ?n ho?c thu?c  men.  H?n ch? s? d?ng r??u.  B? thu?c l, n?u qu v? ht thu?c.  Ng? 7 - 9 ti?ng, ho?c theo khuy?n ngh? c?a chuyn gia ch?m [place] s?c kh?e.  H?n ch? c?ng th?ng.  Gi? cho nh sng d?u nh? n?u nh sng m?nh lm qu v? kh ch?u v lm ch?ng ?au n?a ??u t?i t? h?n. NGAY L?P T?C ?I KHM N?U:   C?n ?au n?a ??u c?a qu v? n?ng h?n.  Qu v? b? s?t.  Qu v? b? c?ng c?.  Qu v? b? m?t th? l?c.  Qu v? b? y?u c? ho?c m?t ki?m sot c?.  Qu v? b?t ??u m?t th?ng b?ng ho?c ?i l?i kh kh?n.  Qu v? c?m th?y mu?n ng?t ho?c ng?t.  Qu v? c nh?ng tri?u ch?ng n?ng khc v?i nh?ng tri?u ch?ng ban ??u. ??M B?O QU V?:   Hi?u r cc h??ng d?n ny.  S? theo di tnh tr?ng c?a mnh.  S? yu c?u tr? gip ngay l?p t?c n?u qu v? c?m th?y khng kh?e ho?c th?y tr?m tr?ng h?n.   Thng tin ny khng nh?m m?c ?ch thay th? cho l?i khuyn m chuyn gia ch?m [place] s?c kh?e ni v?i qu v?. Hy b?o ??m qu v? ph?i th?o lu?n b?t k? v?n ?? g m qu v? c v?i chuyn gia ch?m [place] s?c kh?e c?a qu v?.   Document Released: 06/22/2005 Document Revised: 04/12/2013 Elsevier Interactive Patient Education [id number]2016  Elsevier Inc.

## 2015-12-09 NOTE — ED Provider Notes (Signed)
Please see previous physicians note regarding patient's presenting history and physical, initial ED course, and associated medical decision making. 35 year old male who presents with sudden onset maximal intensity headache, different from history of chronic headaches. He had a CT head that was negative for any acute intracranial processes, but this was performed 6-1/2 hours from symptom onset. Previous provider had discussed potential lumbar puncture for complete rule out of subarachnoid hemorrhage, however patient refusing at this time and has expressed understanding that  there is still a small chance that he could have life-threatening subarachnoid hemorrhage. He had received a migraine cocktail, and states that headache significantly improved. I he is requesting discharge home. He is alert, neurologically intact, and well-appearing. His vital signs are stable. I've reviewed again the risks and benefits of lumbar puncture, and he at this time continues to refuse further workup. Strict return instructions are reviewed. He expressed understanding of all discharge instructions and felt comfortable with the plan of care.   Lavera Guiseana Duo Patrizia Paule, MD 12/09/15 1130

## 2015-12-09 NOTE — ED Notes (Signed)
MD at the bedside  

## 2015-12-09 NOTE — ED Notes (Addendum)
Pt from home with complaints of headache. The pain is at the base of his head at the back. Pt states he has had extra stress lately. 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 Pt states his pain is 10/10. Pt denies sensitivity to light. Pt has a normal neuro exam with equal strength and perception on both sides of his body. Pt's pupils are equal and reactive

## 2015-12-14 ENCOUNTER — Ambulatory Visit (INDEPENDENT_AMBULATORY_CARE_PROVIDER_SITE_OTHER): Payer: BLUE CROSS/BLUE SHIELD

## 2015-12-14 ENCOUNTER — Ambulatory Visit (INDEPENDENT_AMBULATORY_CARE_PROVIDER_SITE_OTHER): Payer: BLUE CROSS/BLUE SHIELD | Admitting: Osteopathic Medicine

## 2015-12-14 VITALS — BP 102/66 | HR 60 | Temp 98.7°F | Resp 18 | Ht 63.0 in | Wt 121.0 lb

## 2015-12-14 DIAGNOSIS — S39012A Strain of muscle, fascia and tendon of lower back, initial encounter: Secondary | ICD-10-CM | POA: Diagnosis not present

## 2015-12-14 DIAGNOSIS — S3992XA Unspecified injury of lower back, initial encounter: Secondary | ICD-10-CM | POA: Diagnosis not present

## 2015-12-14 DIAGNOSIS — M5442 Lumbago with sciatica, left side: Secondary | ICD-10-CM

## 2015-12-14 MED ORDER — TRAMADOL HCL 50 MG PO TABS: ORAL_TABLET | ORAL | Status: DC

## 2015-12-14 MED ORDER — METHYLPREDNISOLONE 4 MG PO TBPK: ORAL_TABLET | ORAL | Status: DC

## 2015-12-14 MED ORDER — METHYLPREDNISOLONE ACETATE 80 MG/ML IJ SUSP
80.0000 mg | Freq: Once | INTRAMUSCULAR | Status: AC
  Administered 2015-12-14: 80 mg via INTRAMUSCULAR

## 2015-12-14 MED ORDER — IBUPROFEN 600 MG PO TABS: 600.0000 mg | ORAL_TABLET | Freq: Three times a day (TID) | ORAL | Status: DC | PRN

## 2015-12-14 MED ORDER — KETOROLAC TROMETHAMINE 30 MG/ML IJ SOLN
30.0000 mg | Freq: Once | INTRAMUSCULAR | Status: AC
  Administered 2015-12-14: 30 mg via INTRAMUSCULAR

## 2015-12-14 NOTE — Progress Notes (Signed)
HPI: Paul Cabrera is a 35 y.o. male who presents to [facility] today for chief complaint of:  Chief Complaint  Patient presents with  . Back Pain    PULLED SOMETHING IN HIS BACK ABOUT TWO WEEKS AGO    BACK PAIN, LEG PAIN . Location: lower back  . Quality: sore, difficult to walk  . Severity: severe . Duration: worse past few days . Timing: injury happened 2 weeks ago . Context: improper lifting injury . Modifying factors: no meidcines at home  . Assoc signs/symptoms: numb in L foot started not sure when but noticed more lately. No fall, weakness, imbalance issues.  Patient has a friend here who speaks fluent [place] and is interpreting.     Past medical, social and family history reviewed: Past Medical History  Diagnosis Date  . Bradycardia    History reviewed. No pertinent past surgical history. Social History  Substance Use Topics  . Smoking status: Never Smoker   . Smokeless tobacco: Not on file  . Alcohol Use: No   History reviewed. No pertinent family history.  Current Outpatient Prescriptions  Medication Sig Dispense Refill  . acetaminophen (TYLENOL) 500 MG tablet Take 1,000 mg by mouth every 6 (six) hours as needed for headache. Reported on 12/14/2015    . cyclobenzaprine (FLEXERIL) 10 MG tablet Take 1 tablet (10 mg total) by mouth 3 (three) times daily as needed for muscle spasms. (Patient not taking: Reported on 12/14/2015) 30 tablet 0  . predniSONE (DELTASONE) 10 MG tablet 3 tabs x3 days and then 2 tabs x3 days and then 1 tab x3 days.  Take w/ food. (Patient not taking: Reported on 12/14/2015) 18 tablet 0   No current facility-administered medications for this visit.   No Known Allergies    Review of Systems: CONSTITUTIONAL:  No  Recent illness HEAD/EYES/EARS/NOSE/THROAT: No  headache, no vision change, CARDIAC: No  chest pain, No  pressure, RESPIRATORY: No  cough, No  shortness of breath GASTROINTESTINAL: No  nausea, No  vomiting,  No  abdominal pain MUSCULOSKELETAL: (+) Back pain myalgia/arthralgia as per history of present illness GENITOURINARY: No  incontinence, NEUROLOGIC: No  weakness, No  dizziness, No  slurred speech. Reports some numbness in the left foot.   Exam:  BP 102/66 mmHg  Pulse 60  Temp(Src) 98.7 F (37.1 C) (Oral)  Resp 18  Ht  (1.6 m)  Wt 121 lb (54.885 kg)  BMI 21.44 kg/m2  SpO2 97% Constitutional: VS see above. General Appearance: alert, well-developed, well-nourished, NAD Eyes: Normal lids and conjunctive, non-icteric sclera, Ears, Nose, Mouth, Throat: MMM, Normal external inspection ears/nares/mouth/lips/gums Neck: No masses, trachea midline.  Respiratory: Normal respiratory effort.  Gastrointestinal: Nontender, no masses.  Musculoskeletal: Gait antalgic. No clubbing/cyanosis of digits.  Positive paraspinal tenderness on both sides lumbar spine/sacroiliac region. Positive straight leg raise on the left Neurological: No cranial nerve deficit on limited exam. Motor intact bilaterally 5 out of 5, no foot drop  No results found for this or any previous visit (from the past 72 hour(s)).  Imaging personally reviewed, I see nothing acute to explain patient's symptoms, see below for radiologist over-read.   Dg Lumbar Spine Complete  12/14/2015  CLINICAL DATA:  Lifting injury 1 week ago. EXAM: LUMBAR SPINE - COMPLETE 4+ VIEW COMPARISON:  None. FINDINGS: There is no evidence of lumbar spine fracture. Alignment is normal. Intervertebral disc spaces are maintained. IMPRESSION: Negative. Electronically Signed   By: Gerome Sam III M.D  On: 12/14/2015 11:25      ASSESSMENT/PLAN: Likely nerve compression due to muscle spasm, patient denies shooting pain all down the legs, there is some numbness reports on the left side, gait is normal, no saddle anesthesia symptoms. Patient has not tried any medications at home for this. Given injection of Toradol and steroids here in the office, sent home  with instructions on anti-inflammatory use with ibuprofen, breakthrough pain control with tramadol, steroid pak to start tomorrow. Translator reviewed all this with the patient, all questions were answered. ER/RTC precautions were reviewed, patient information was printed in his native language. Advised would recommend starting physical therapy with acute severe pain resolves, medications as directed, he has a follow-up appointment with his primary care physician later this week. If physical therapy is not helping, or if anything gets worse or changes in the meantime, would recommend MRI.   Lumbar strain, initial encounter - Suspect possible bulging disc, would confirm with MRI if symptoms do not resolve with medical treatment and physical therapy, or symptoms get worse or change - Plan: DG Lumbar Spine Complete, methylPREDNISolone (MEDROL DOSEPAK) 4 MG TBPK tablet, traMADol (ULTRAM) 50 MG tablet, ibuprofen (ADVIL,MOTRIN) 600 MG tablet, Ambulatory referral to Physical Therapy, ketorolac (TORADOL) 30 MG/ML injection 30 mg, methylPREDNISolone acetate (DEPO-MEDROL) injection 80 mg    Visit summary printed and instructions reviewed with the patient. All questions answered. Return for follow up of pain in 2 - 4 weeks, sooner if needed or if worse, or follow up with Dr Beverely Lowabori.

## 2015-12-14 NOTE — Patient Instructions (Addendum)
C?ng th?t l?ng cng (Lumbosacral Strain) C?ng vng th?t l?ng cng l tnh tr?ng c?ng b?t k? b? ph?n no t?o nn nh?ng ??t s?ng th?t l?ng cng c?a qu v?. Cc ??t s?ng th?t l?ng cng c?a qu v? l nh?ng ph?n x??ng t?o nn m?t ph?n ba pha d??i x??ng s?ng. Cc ??t s?ng th?t l?ng cng c?a qu v? ???c g?n v?i nhau b?ng cc c? v m x? ch?c ch?n (dy ch?ng).  NGUYN NHN.  M?t c ?nh b?t ng? vo l?ng c th? gy c?ng th?t l?ng cng. Ngoi ra, b?t k? ?i?u g lm c?ng cc c? ? th?t l?ng qu m?c c?ng c th? gy ra ch?ng c?ng th?t l?ng cng ny. Tnh tr?ng ny th??ng th?y ? nh?ng ng??i g?ng s?c qu m?c, ng, nng v?t n?ng, g?p ng??i, ho?c ci xu?ng l?p ?i l?p l?i nhi?u l?n. CC Y?U T? NGUY C?  Cng vi?c ?i h?i s?c l?c.  Tham gia vo cc mn th? thao ??y ho?c ko ?i h?i ph?i v?n l?ng ??t ng?t (qu?n v?t, ?nh gn, bng chy).  Nng t?.  U?n cong qu m?c ph?n th?t l?ng.  Khung x??ng ch?u nghing v? pha tr??c.  Y?u c? l?ng ho?c y?u c? b?ng ho?c c? hai.  Gn kheo c?ng. D?U HI?U V TRI?U CH?NG  C?ng th?t l?ng cng c th? gy ?au ? vng b? ch?n th??ng ho?c c?n ?au di chuy?n (lan t?a) xu?ng chn qu v?.  CH?N ?ON Chu[place] gia ch?m [place] s?c kh?e th??ng c th? ch?n ?on c?ng th?t l?ng cng b?ng cch khm th?c th?Suzzette Righter m?t s? tr??ng h?p qu v? c th? c?n cc ki?m tra nh? ch?p X quang.  ?I?U TR?  Vi?c ?i?u tr? ch?n th??ng vng th?t l?ng ty thu?c vo nhi?u y?u t? m bc s? lm sng s? ph?i ?nh gi. Tuy nhin, h?u h?t vi?c ?i?u tr? s? bao g?m vi?c s? d?ng thu?c ch?ng vim. H??NG D?N CH?M [place] T?I NH   Trnh nh?ng ho?t ??ng th? ch?t n?ng (qu?n v?t, racquetball, l??t vn n??c) n?u qu v? khng c ?? tnh tr?ng th? l?c ?? th?c hi?n cc ho?t ??ng ?. ?i?u ny c th? lm tr?m tr?ng thm ho?c gy ra v?n ??.  N?u qu v? c m?t v?n ?? ? l?ng, hy trnh nh?ng mn th? thao ?i h?i ph?i c? ??ng c? th? ??t ng?t. B?i v ?i b? th??ng l nh?ng ho?t ??ng an ton h?n.  Duy tr t? th? thch h?p.  Duy tr cn n?ng c  l?i cho s?c kh?e.  ??i v?i nh?ng tnh tr?ng c?p tnh, qu v? c th? ch??m ? l?nh ln vng b? th??ng.  Cho ? l?nh vo ti nh?a.  ?? kh?n t?m vo gi?a da v ti.  Ch??m ? l?nh ln vng b? th??ng trong kho?ng 20 pht, 2 - 3 l?n m?i ngy.  Khi vng th?t l?ng b?t ??u lnh, c th? t?p cc bi t?p ko c?ng ho?c t?ng c??ng s?c kh?e. ?I KHM N?U:  ?au l?ng tr? nn t? h?n.  Qu v? b? ?au l?ng n?ng v khng ?? khi dng thu?c. NGAY L?P T?C ?I KHM N?U:   Qu v? b? t, ?au bu?t, y?u, ho?c cc v?n ?? khi c? ??ng tay ho?c chn.  C s? thay ??i trong vi?c ki?m sot ??i ti?n ho?c ti?u ti?n.  Qu v? c c?n ?au t?ng ln ? b?t k? vng no c?a c? th?, k? c? ? vng b?ng (b?ng).  Qu v? th?y kh th?,  chng m?t, ho?c c?m th?y mu?n ng?t.  Qu v? c?m th?y kh ch?u ? d? dy (bu?n nn), nn (nn m?a), ho?c ?? m? hi.  Qu v? th?y ??i m?u ngn chn ho?c chn, ho?c bn chn qu v? r?t l?nh. ??M B?O QU V?:   Hi?u r cc h??ng d?n ny.  S? theo di tnh tr?ng c?a mnh.  S? yu c?u tr? gip ngay l?p t?c n?u qu v? c?m th?y khng kh?e ho?c th?y tr?m tr?ng h?n.   Thng tin ny khng nh?m m?c ?ch thay th? cho l?i khuyn m chuyn gia ch?m [place] s?c kh?e ni v?i qu v?. Hy b?o ??m qu v? ph?i th?o lu?n b?t k? v?n ?? g m qu v? c v?i chuyn gia ch?m [place] s?c kh?e c?a qu v?.   Document Released: 04/01/2005 Document Revised: 06/27/2013 Elsevier Interactive Patient Education [employer]2016 Elsevier Inc.      IF you received an x-ray today, you will receive an invoice from [facility]. Please contact [facility] at [phone] with questions or concerns regarding your invoice.   IF you received labwork today, you will receive an invoice from [facility]. Please contact [facility] at [phone] with questions or concerns regarding your invoice.   Our billing staff will not be able to assist you with questions regarding bills from these companies.  You will be  contacted with the lab results as soon as they are available. The fastest way to get your results is to activate your My Chart account. Instructions are located on the last page of this paperwork. If you have not heard from us regarding the results in 2 weeks, please contact this office.

## 2015-12-19 ENCOUNTER — Ambulatory Visit: Payer: Self-pay | Admitting: Family Medicine

## 2015-12-20 ENCOUNTER — Telehealth: Payer: Self-pay | Admitting: Family Medicine

## 2015-12-20 NOTE — Telephone Encounter (Signed)
[facility]. Stated he did not know Dr. Beverely Lowabori had relocated.  CHarge or No Charge?

## 2015-12-20 NOTE — Telephone Encounter (Signed)
No charge. 

## 2015-12-24 ENCOUNTER — Encounter: Payer: Self-pay | Admitting: Family Medicine

## 2015-12-24 DIAGNOSIS — M5442 Lumbago with sciatica, left side: Secondary | ICD-10-CM | POA: Diagnosis not present

## 2015-12-24 DIAGNOSIS — S39012D Strain of muscle, fascia and tendon of lower back, subsequent encounter: Secondary | ICD-10-CM | POA: Diagnosis not present

## 2015-12-27 DIAGNOSIS — M5442 Lumbago with sciatica, left side: Secondary | ICD-10-CM | POA: Diagnosis not present

## 2015-12-27 DIAGNOSIS — S39012D Strain of muscle, fascia and tendon of lower back, subsequent encounter: Secondary | ICD-10-CM | POA: Diagnosis not present

## 2016-07-24 ENCOUNTER — Telehealth: Payer: Self-pay | Admitting: Family Medicine

## 2016-07-24 NOTE — Telephone Encounter (Signed)
Ok w/ me 

## 2016-07-24 NOTE — Telephone Encounter (Signed)
OK 

## 2016-07-24 NOTE — Telephone Encounter (Signed)
Patient is requesting a transfer of care from Dr. Beverely Lowabori to Dr. Carmelia RollerWendling due to Dr[place] Beverely [place]abori moving to [place]. Please advise

## 2016-07-27 NOTE — Telephone Encounter (Signed)
Appt scheduled 09/07/16

## 2016-09-07 ENCOUNTER — Ambulatory Visit: Payer: BLUE CROSS/BLUE SHIELD | Admitting: Family Medicine

## 2016-09-14 ENCOUNTER — Ambulatory Visit: Payer: BLUE CROSS/BLUE SHIELD | Admitting: Family Medicine

## 2016-10-01 ENCOUNTER — Ambulatory Visit (INDEPENDENT_AMBULATORY_CARE_PROVIDER_SITE_OTHER): Payer: BLUE CROSS/BLUE SHIELD | Admitting: Family Medicine

## 2016-10-01 ENCOUNTER — Ambulatory Visit (HOSPITAL_BASED_OUTPATIENT_CLINIC_OR_DEPARTMENT_OTHER)
Admission: RE | Admit: 2016-10-01 | Discharge: 2016-10-01 | Disposition: A | Discharge status: Home / Self Care | Payer: BLUE CROSS/BLUE SHIELD | Source: Ambulatory Visit | Attending: Family Medicine | Admitting: Family Medicine

## 2016-10-01 ENCOUNTER — Encounter: Payer: Self-pay | Admitting: Family Medicine

## 2016-10-01 VITALS — BP 108/70 | HR 62 | Temp 98.3°F | Ht 63.0 in | Wt 120.2 lb

## 2016-10-01 DIAGNOSIS — R140 Abdominal distension (gaseous): Secondary | ICD-10-CM | POA: Diagnosis not present

## 2016-10-01 LAB — COMPREHENSIVE METABOLIC PANEL
ALBUMIN: 4.4 g/dL (ref 3.6–5.1)
ALT: 17 U/L (ref 9–46)
AST: 16 U/L (ref 10–40)
Alkaline Phosphatase: 46 U/L (ref 40–115)
BUN: 14 mg/dL (ref 7–25)
CHLORIDE: 103 mmol/L (ref 98–110)
CO2: 24 mmol/L (ref 20–31)
CREATININE: 0.86 mg/dL (ref 0.60–1.35)
Calcium: 9.4 mg/dL (ref 8.6–10.3)
Glucose, Bld: 92 mg/dL (ref 65–99)
POTASSIUM: 4.4 mmol/L (ref 3.5–5.3)
SODIUM: 139 mmol/L (ref 135–146)
Total Bilirubin: 0.5 mg/dL (ref 0.2–1.2)
Total Protein: 7.2 g/dL (ref 6.1–8.1)

## 2016-10-01 LAB — CBC
HCT: 44.2 % (ref 38.5–50.0)
Hemoglobin: 15.5 g/dL (ref 13.2–17.1)
MCH: 32.2 pg (ref 27.0–33.0)
MCHC: 35.1 g/dL (ref 32.0–36.0)
MCV: 91.9 fL (ref 80.0–100.0)
MPV: 9.2 fL (ref 7.5–12.5)
Platelets: 235 10*3/uL (ref 140–400)
RBC: 4.81 MIL/uL (ref 4.20–5.80)
RDW: 13.2 % (ref 11.0–15.0)
WBC: 5.3 10*3/uL (ref 3.8–10.8)

## 2016-10-01 MED ORDER — OMEPRAZOLE 20 MG PO CPDR: 20.0000 mg | DELAYED_RELEASE_CAPSULE | Freq: Two times a day (BID) | ORAL | 1 refills | Status: DC

## 2016-10-01 NOTE — Progress Notes (Signed)
Pre visit review using our clinic review tool, if applicable. No additional management support is needed unless otherwise documented below in the visit note. 

## 2016-10-01 NOTE — Progress Notes (Signed)
Chief Complaint  Patient presents with  . 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  Duration: 4 months Nighttime awakenings? No Bleeding? No Weight loss? No  Palliation: Nothing other than avoiding eating/drinking  Provocation: Eating and drinking Associated symptoms: none Denies: fever, nausea and vomiting Treatment to date: None  ROS: Constitutional: No fevers GI: No N/V/D/C, no bleeding + pain  Past Medical History:  Diagnosis Date  . Bradycardia    Family History  Problem Relation Age of Onset  . Family history unknown: Yes   Past Surgical History:  Procedure Laterality Date  . NO PAST SURGERIES      BP 108/70 (BP Location: Left Arm, Patient Position: Sitting, Cuff Size: Normal)   Pulse 62   Temp 98.3 F (36.8 C) (Oral)   Ht '5\' 3"'  (1.6 m)   Wt 120 lb 3.2 oz (54.5 kg)   SpO2 99%   BMI 21.29 kg/m  Gen.: Awake, alert, appears stated age 37: Mucous membranes moist without mucosal lesions Heart: Regular rate and rhythm without murmurs Lungs: Clear auscultation bilaterally, no rales or wheezing, normal effort without accessory muscle use. Abdomen: Bowel sounds are present. Abdomen is soft, nontender, nondistended, no masses or organomegaly. Negative Murphy's, Rovsing's, McBurney's, and Carnett's sign. MSK: No CVA tenderness Psych: Age appropriate judgment and insight. Normal mood and affect.  Postprandial abdominal bloating - Plan: CBC, Comprehensive metabolic panel, Sed Rate (ESR), US Abdomen Limited RUQ, omeprazole (PRILOSEC) 20 MG capsule  Orders as above. If he gets any benefit from the PPI, may transition to Protonix versus increasing dose. May also refer to GI if no improvement. F/u in 4 weeks. Pt and his translator voiced understanding and agreement to the plan.  [place],  DO 10/01/16 4:10 PM

## 2016-10-01 NOTE — Patient Instructions (Signed)
Keep a symptom diary/journal to try and associated particular foods or factors that affect, either positively or negatively, your issue.

## 2016-10-02 LAB — SEDIMENTATION RATE: Sed Rate: 4 mm/hr (ref 0–15)

## 2016-10-08 ENCOUNTER — Ambulatory Visit (HOSPITAL_BASED_OUTPATIENT_CLINIC_OR_DEPARTMENT_OTHER)
Admission: RE | Admit: 2016-10-08 | Discharge: 2016-10-08 | Disposition: A | Discharge status: Home / Self Care | Payer: BLUE CROSS/BLUE SHIELD | Source: Ambulatory Visit | Attending: Family Medicine | Admitting: Family Medicine

## 2016-10-08 DIAGNOSIS — R140 Abdominal distension (gaseous): Secondary | ICD-10-CM | POA: Insufficient documentation

## 2016-10-12 ENCOUNTER — Other Ambulatory Visit: Payer: Self-pay | Admitting: Family Medicine

## 2016-10-12 DIAGNOSIS — R935 Abnormal findings on diagnostic imaging of other abdominal regions, including retroperitoneum: Secondary | ICD-10-CM

## 2016-10-12 DIAGNOSIS — R932 Abnormal findings on diagnostic imaging of liver and biliary tract: Secondary | ICD-10-CM

## 2016-10-17 ENCOUNTER — Ambulatory Visit (HOSPITAL_BASED_OUTPATIENT_CLINIC_OR_DEPARTMENT_OTHER): Payer: BLUE CROSS/BLUE SHIELD

## 2016-10-26 ENCOUNTER — Ambulatory Visit: Payer: BLUE CROSS/BLUE SHIELD

## 2016-10-26 DIAGNOSIS — D1803 Hemangioma of intra-abdominal structures: Secondary | ICD-10-CM | POA: Diagnosis not present

## 2016-10-26 DIAGNOSIS — R935 Abnormal findings on diagnostic imaging of other abdominal regions, including retroperitoneum: Secondary | ICD-10-CM

## 2016-10-26 MED ORDER — GADOBENATE DIMEGLUMINE 529 MG/ML IV SOLN
10.0000 mL | Freq: Once | INTRAVENOUS | Status: AC | PRN
  Administered 2016-10-26: 10 mL via INTRAVENOUS

## 2016-11-12 ENCOUNTER — Ambulatory Visit (INDEPENDENT_AMBULATORY_CARE_PROVIDER_SITE_OTHER): Payer: BLUE CROSS/BLUE SHIELD | Admitting: Family Medicine

## 2016-11-12 ENCOUNTER — Encounter: Payer: Self-pay | Admitting: Family Medicine

## 2016-11-12 VITALS — BP 112/60 | HR 51 | Temp 98.0°F | Ht 63.0 in | Wt 118.0 lb

## 2016-11-12 DIAGNOSIS — R1011 Right upper quadrant pain: Secondary | ICD-10-CM | POA: Diagnosis not present

## 2016-11-12 DIAGNOSIS — Z23 Encounter for immunization: Secondary | ICD-10-CM

## 2016-11-12 DIAGNOSIS — Z205 Contact with and (suspected) exposure to viral hepatitis: Secondary | ICD-10-CM | POA: Diagnosis not present

## 2016-11-12 NOTE — Progress Notes (Signed)
Chief Complaint  Patient presents with  . Follow-up    4 weeks to recheck bloating-pt states still have the bloating it is the same    Subjective: Patient is a 36 y.o. male here for f/u abd pain.   Patient is having continued symptoms of bloating, early satiety, and irritation by foods. His abdominal ultrasound so that some biliary sludge without stones or evidence of inflammation. There was an incidental finding of the liver is ruled out to be a simple cyst on MRI. He was trialed on omeprazole, however stopped it after a couple weeks as it was not helpful. He has not taken it in greater than 2 weeks. He continues to deny bleeding, weight loss, a time awakenings, or fevers. He did start taking Creon which was somewhat helpful. Pt does have questionable exposure to Hep C so is requesting testing for that.    ROS: GI: As noted in HPI  Family History  Problem Relation Age of Onset  . Family history unknown: Yes   Past Medical History:  Diagnosis Date  . Bradycardia    No Known Allergies No current outpatient prescriptions on file.  Objective: BP 112/60 (BP Location: Left Arm, Patient Position: Sitting, Cuff Size: Normal)   Pulse (!) 51   Temp 98 F (36.7 C) (Oral)   Ht 5\' 3"  (1.6 m)   Wt 118 lb (53.5 kg)   SpO2 99%   BMI 20.90 kg/m  General: Awake, appears stated age HEENT: MMM, EOMi Heart: RRR, no murmurs Lungs: CTAB, no rales, wheezes or rhonchi. No accessory muscle use Abd: BS+, soft, NT, ND, no masses or organomegaly, Neg Murphy's, Carnett's, McBurney's. Psych: Age appropriate judgment and insight, normal affect and mood  Assessment and Plan: Right upper quadrant abdominal pain - Plan: H. pylori breath test  Exposure to hepatitis C - Plan: Hepatitis C Antibody  Need for Tdap vaccination - Plan: Tdap vaccine greater than or equal to 7yo IM  Orders as above. If H Pylori is neg, will refer to GI for their opinion. Should be OK as he has not taken in <2 weeks. F/u prn  otherwise.  The patient voiced understanding and agreement to the plan.  Jilda Rocheicholas [place], DO 11/12/16  3:19 PM

## 2016-11-12 NOTE — Patient Instructions (Addendum)
If your H pylori test is negative, we will send you to a gastroenterologist.  If you do not hear anything about your referral in the next 1-2 weeks, call our office and ask for an update.  Give us 2-3 business days to get the results of your labs back.

## 2016-11-13 ENCOUNTER — Other Ambulatory Visit: Payer: Self-pay | Admitting: Family Medicine

## 2016-11-13 DIAGNOSIS — R109 Unspecified abdominal pain: Secondary | ICD-10-CM

## 2016-11-13 DIAGNOSIS — R6881 Early satiety: Secondary | ICD-10-CM

## 2016-11-13 LAB — HEPATITIS C ANTIBODY: HCV Ab: NEGATIVE

## 2016-11-13 LAB — H. PYLORI BREATH TEST: H. pylori Breath Test: NOT DETECTED

## 2016-12-16 ENCOUNTER — Encounter: Payer: Self-pay | Admitting: Family Medicine

## 2018-07-07 ENCOUNTER — Encounter: Payer: Self-pay | Admitting: Family Medicine

## 2018-07-07 ENCOUNTER — Ambulatory Visit: Payer: BLUE CROSS/BLUE SHIELD | Admitting: Family Medicine

## 2018-07-07 VITALS — BP 118/68 | HR 71 | Temp 98.5°F | Ht 63.0 in | Wt 124.0 lb

## 2018-07-07 DIAGNOSIS — R12 Heartburn: Secondary | ICD-10-CM | POA: Diagnosis not present

## 2018-07-07 DIAGNOSIS — R140 Abdominal distension (gaseous): Secondary | ICD-10-CM | POA: Diagnosis not present

## 2018-07-07 DIAGNOSIS — R6881 Early satiety: Secondary | ICD-10-CM

## 2018-07-07 MED ORDER — PANTOPRAZOLE SODIUM 40 MG PO TBEC: 40.0000 mg | DELAYED_RELEASE_TABLET | Freq: Every day | ORAL | 1 refills | Status: DC

## 2018-07-07 MED ORDER — PANTOPRAZOLE SODIUM 40 MG PO TBEC
40.0000 mg | DELAYED_RELEASE_TABLET | Freq: Every day | ORAL | 1 refills | Status: DC
Start: 2018-07-07 — End: 2018-07-07

## 2018-07-07 NOTE — Patient Instructions (Addendum)
We will be in touch regarding your test results.   Let me know if this does not improve things.  OK to stay on Pepcid.   Let us know if you need anything.

## 2018-07-07 NOTE — Progress Notes (Signed)
Pre visit review using our clinic review tool, if applicable. No additional management support is needed unless otherwise documented below in the visit note. 

## 2018-07-07 NOTE — Progress Notes (Signed)
CC: Abd bloating  Subjective: Patient is a 38 y.o. male here for stomach bloating. Here w a friend who helps interpret.   2 mo duration. Fullness/bloating. Has lost a bit of weight. Will have some regurgitation, early satiety, belching. No bowel changes, urinary complaints, or fevers. Started after he got back from [place]. Had hx of this in past, got better spontaneously. A little diff location, neg [place] and PPI trial.   ROS: Abd: As noted in HPI  Past Medical History:  Diagnosis Date  . Bradycardia     Objective: BP 118/68 (BP Location: Left Arm, Patient Position: Sitting, Cuff Size: Normal)   Pulse 71   Temp 98.5 F (36.9 C) (Oral)   Ht 5\' 3"  (1.6 m)   Wt 124 lb (56.2 kg)   SpO2 98%   BMI 21.97 kg/m  General: Awake, appears stated age HEENT: MMM Abd: BS+, soft, mild ttp near epigastric/umbilical region, no masses or organomegaly. Heart: RRR Lungs: CTAB, no rales, wheezes or rhonchi. No accessory muscle use Psych: Age appropriate judgment and insight, normal affect and mood  Assessment and Plan: Early satiety - Plan: H. pylori breath test, pantoprazole (PROTONIX) 40 MG tablet  Abdominal bloating - Plan: H. pylori breath test, pantoprazole (PROTONIX) 40 MG tablet  Waterbrash symptom - Plan: H. pylori breath test, pantoprazole (PROTONIX) 40 MG tablet  Trial PPI again and ck H Pylori prior to starting. If no improvement over next 4-6 weeks, will again refer to GI. S/s's sounds like reflux.  The patient, through his interpreting friend, voiced understanding and agreement to the plan.  Jilda Roche [facility], DO 07/07/18  4:04 PM

## 2018-07-08 LAB — H. PYLORI BREATH TEST: H. PYLORI BREATH TEST: NOT DETECTED

## 2018-09-20 IMAGING — MR MR ABDOMEN WO/W CM
16 of 17 series · 44 of 48 positions shown · IV contrast (10 ML MULTIHANCE)
Comparison: Ultrasound abdomen from 10/08/2016

CLINICAL DATA: Echogenic lesion in the right hepatic lobe on
ultrasound, for further workup.

EXAM:
MRI ABDOMEN WITHOUT AND WITH CONTRAST
TECHNIQUE: Multiplanar multisequence MR imaging of the abdomen was performed
both before and after the administration of intravenous contrast.
CONTRAST:  10mL MULTIHANCE GADOBENATE DIMEGLUMINE 529 MG/ML IV SOLN

[Series 2: cor ssfse / · coronal · 7.0mm · 1.48mm/px · 2 of 26 slices shown]
[im 1/26]
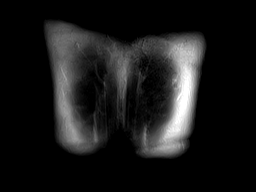
[im 26/26]
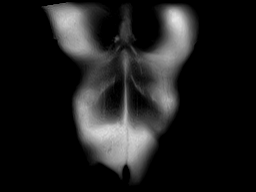

[Series 3: T2 fat-sat · axial · 6.0mm · 1.48mm/px · z∈[-102,+114]mm · 2 of 31 slices shown]
[im 1/31]
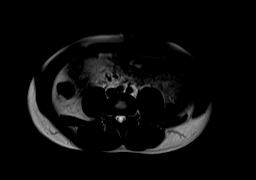
[im 31/31]
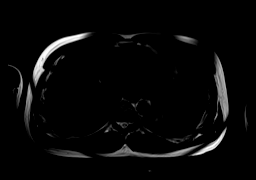

[Series 4: T1 · axial · 6.0mm · 0.74mm/px · z∈[-102,+114]mm · 4 of 62 slices shown]
[im 1/62]
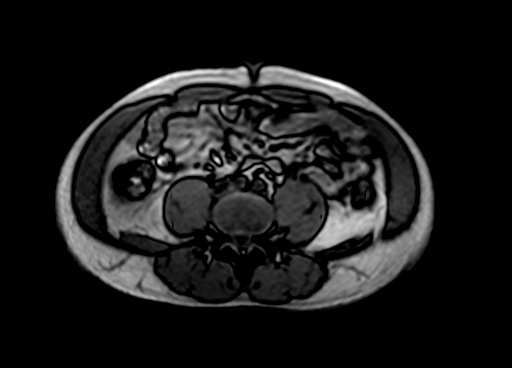
[im 21/62]
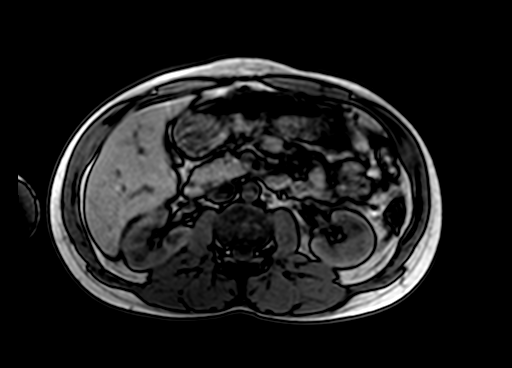
[im 41/62]
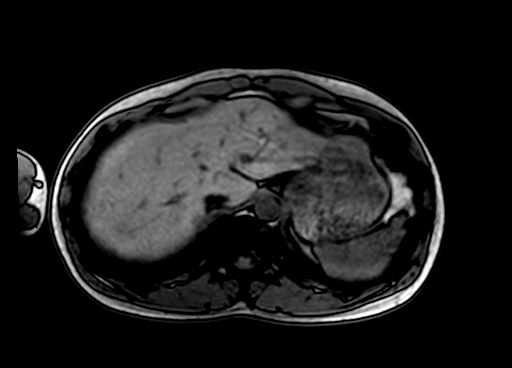
[im 62/62]
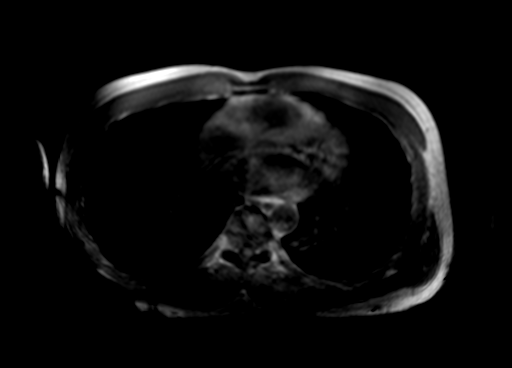

[Series 5: bSSFP · axial · 6.0mm · 0.74mm/px · z∈[-102,+114]mm · 2 of 31 slices shown]
[im 1/31]
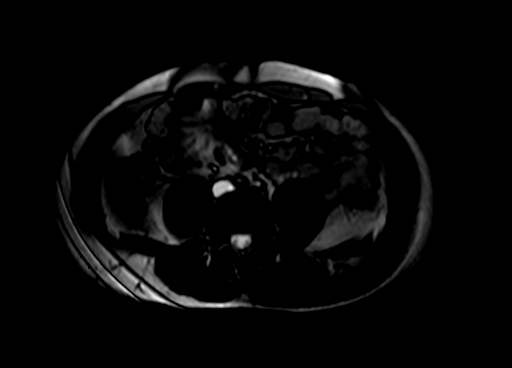
[im 31/31]
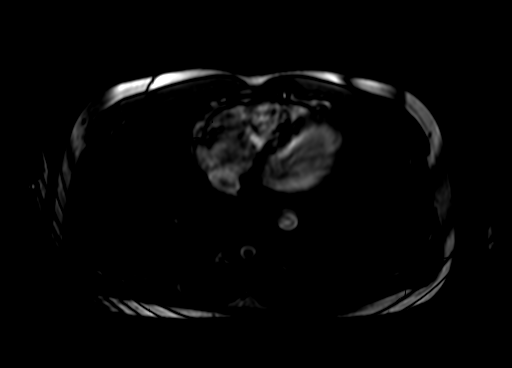

[Series 6: axial dynamic pre · axial · non-contrast · 3.0mm · 1.19mm/px · z∈[-100,+113]mm · 3 of 72 slices shown]
[im 1/72]
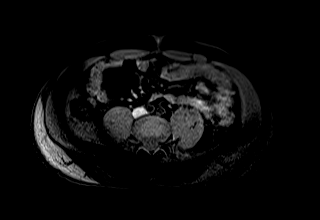
[im 36/72]
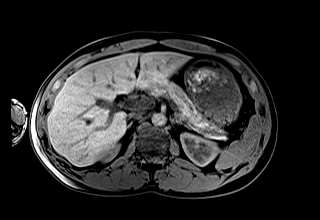
[im 72/72]
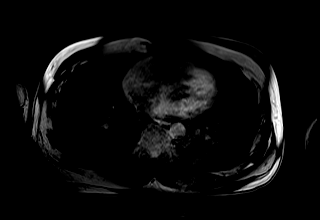

[Series 7: DWI · axial · 6.0mm · 2.00mm/px · z∈[-109,+121]mm · 4 of 94 slices shown (1 of 2)]
[im 1/94]
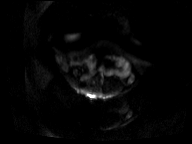
[im 32/94]
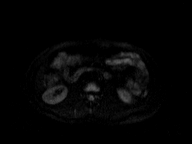
[im 63/94]
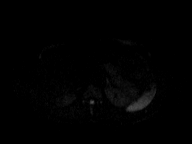
[im 94/94]
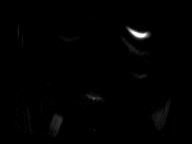

[Series 8: DWI · axial · 6.0mm · 2.00mm/px · z∈[-109,+121]mm · 2 of 33 slices shown (2 of 2)]
[im 1/33]
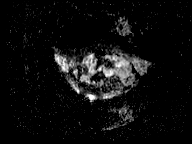
[im 33/33]
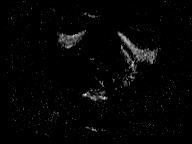

[Series 9: axial dynamic post · axial · 3.0mm · 1.19mm/px · z∈[-100,+113]mm · 3 of 72 slices shown (1 of 6)]
[im 1/72]
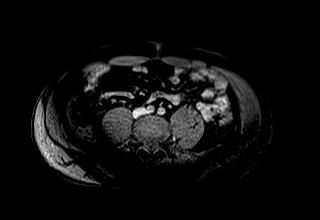
[im 36/72]
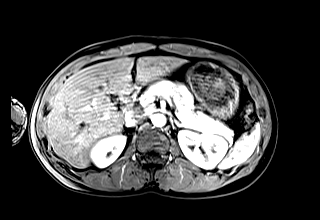
[im 72/72]
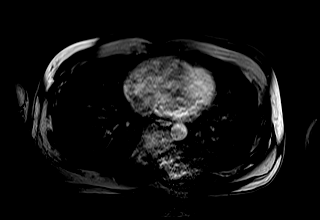

[Series 10: axial dynamic post · axial · 3.0mm · 1.19mm/px · z∈[-100,+113]mm · 3 of 72 slices shown (2 of 6)]
[im 1/72]
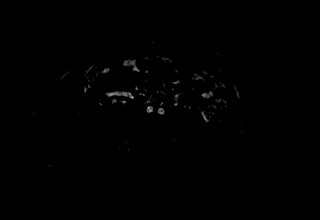
[im 36/72]
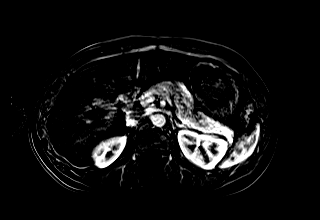
[im 72/72]
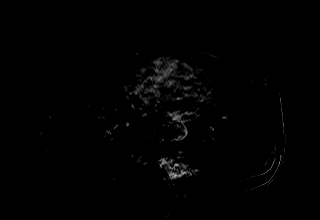

[Series 11: axial dynamic post · axial · 3.0mm · 1.19mm/px · z∈[-100,+113]mm · 3 of 72 slices shown (3 of 6)]
[im 1/72]
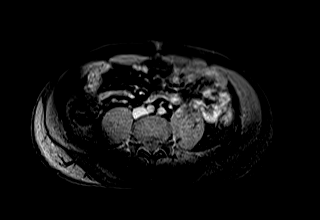
[im 36/72]
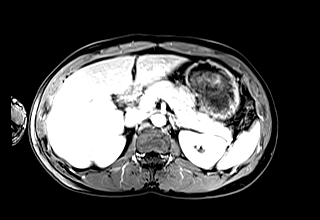
[im 72/72]
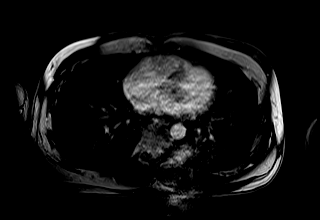

[Series 12: axial dynamic post · axial · 3.0mm · 1.19mm/px · z∈[-100,+113]mm · 3 of 72 slices shown (4 of 6)]
[im 1/72]
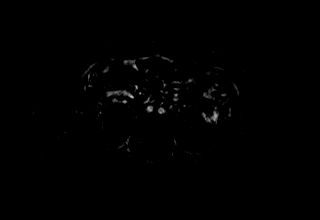
[im 36/72]
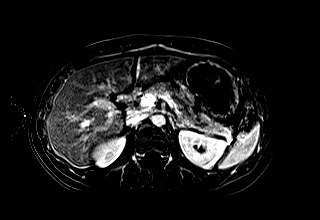
[im 72/72]
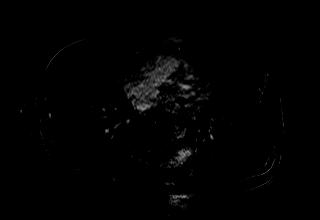

[Series 13: axial dynamic post · axial · 3.0mm · 1.19mm/px · z∈[-100,+113]mm · 3 of 72 slices shown (5 of 6)]
[im 1/72]
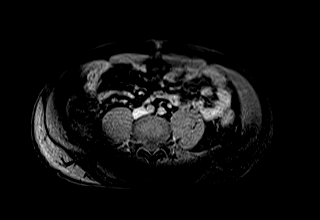
[im 36/72]
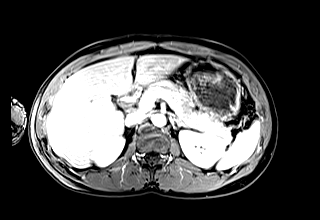
[im 72/72]
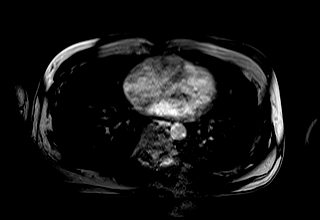

[Series 14: axial dynamic post · axial · 3.0mm · 1.19mm/px · z∈[-100,+113]mm · 3 of 72 slices shown (6 of 6)]
[im 1/72]
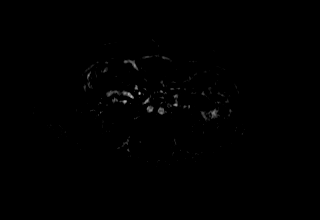
[im 36/72]
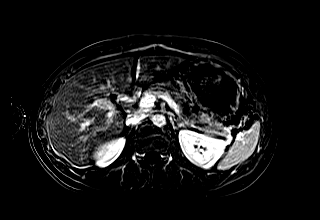
[im 72/72]
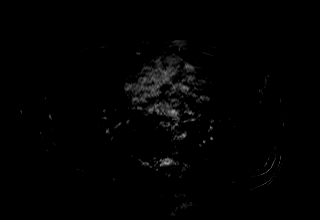

[Series 16: axial ssfse / · axial · 6.0mm · 1.19mm/px · 1 of 31 slices shown]
[im 1/31]
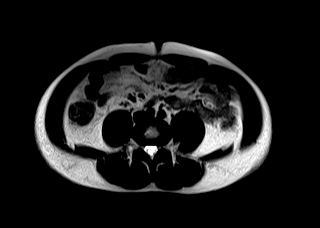

[Series 17: axial dynamic delayed · axial · 3.0mm · 1.19mm/px · z∈[-100,+113]mm · 3 of 72 slices shown]
[im 1/72]
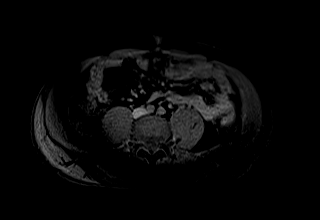
[im 36/72]
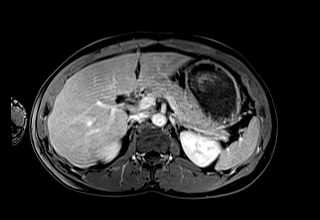
[im 72/72]
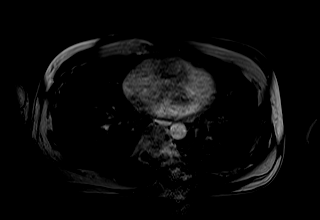

[Series 18: axial dynamic delayed_sub · axial · 3.0mm · 1.19mm/px · z∈[-100,+113]mm · 3 of 72 slices shown]
[im 1/72]
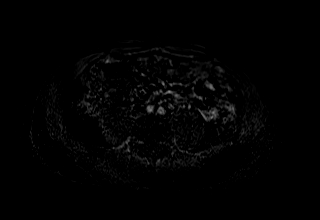
[im 36/72]
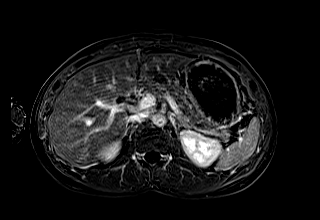
[im 72/72]
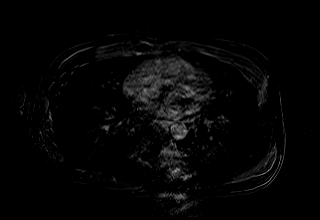

[44 of 48 positions shown; findings below may reference images not displayed]

FINDINGS: Despite efforts by the technologist and patient, motion artifact is
present on today's exam and could not be eliminated. This reduces
exam sensitivity and specificity.

Lower chest: Unremarkable

Hepatobiliary: The posterior right hepatic lobe echogenic lesion
shown on ultrasound corresponds to a lesion with high T2 and low T1
signal characteristics measuring 1.0 by 0.7 cm on image [DATE], which
demonstrates progressive contrast enhancement and delayed contrast
enhancement which, when combined with the echogenic appearance on
ultrasound, is compatible with a small hemangioma. No other
significant liver lesions are identified.

Pancreas:  Unremarkable

Spleen:  Unremarkable

Adrenals/Urinary Tract:  Unremarkable

Stomach/Bowel: Unremarkable

Vascular/Lymphatic:  Unremarkable

Other:  No supplemental non-categorized findings.

Musculoskeletal: Unremarkable
IMPRESSION: 1. The signal and enhancement characteristics of the small lesion in
segment 7 of the liver are compatible with a small benign hepatic
hemangioma. No further workup necessary.

## 2019-09-18 ENCOUNTER — Ambulatory Visit: Payer: BC Managed Care – PPO | Attending: Internal Medicine

## 2019-09-18 DIAGNOSIS — Z23 Encounter for immunization: Secondary | ICD-10-CM

## 2019-09-18 NOTE — Progress Notes (Signed)
   Covid-19 Vaccination Clinic  Name:  Donovan Persley    MRN: [id number] DOB: 07/08/1980  09/18/2019  Mr. Lachney was observed post Covid-19 immunization for 15 minutes without incident. He was provided with Vaccine Information Sheet and instruction to access the V-Safe system.   Mr. Lassen was instructed to call 911 with any severe reactions post vaccine: Marland Kitchen Difficulty breathing  . Swelling of face and throat  . A fast heartbeat  . A bad rash all over body  . Dizziness and weakness   Immunizations Administered    Name Date Dose VIS Date Route   Pfizer COVID-19 Vaccine 09/18/2019  3:37 PM 0.3 mL 06/16/2019 Intramuscular   Manufacturer: [employer], [employer]   Lot: [id number]   NDC: [address]-6

## 2019-09-30 ENCOUNTER — Ambulatory Visit: Payer: BLUE CROSS/BLUE SHIELD

## 2019-10-10 ENCOUNTER — Ambulatory Visit: Payer: BC Managed Care – PPO

## 2019-10-10 ENCOUNTER — Ambulatory Visit: Payer: BC Managed Care – PPO | Attending: Internal Medicine

## 2019-10-10 DIAGNOSIS — Z23 Encounter for immunization: Secondary | ICD-10-CM

## 2019-10-10 NOTE — Progress Notes (Signed)
   Covid-19 Vaccination Clinic  Name:  Paul Cabrera    MRN: [id number] DOB: December 23, 1980  10/10/2019  [name] was observed post Covid-19 immunization for 15 minutes without incident. He was provided with Vaccine Information Sheet and instruction to access the V-Safe system.   [name] was instructed to call 911 with any severe reactions post vaccine: Marland Kitchen Difficulty breathing  . Swelling of face and throat  . A fast heartbeat  . A bad rash all over body  . Dizziness and weakness   Immunizations Administered    Name Date Dose VIS Date Route   Pfizer COVID-19 Vaccine 10/10/2019  2:52 PM 0.3 mL 06/16/2019 Intramuscular   Manufacturer: [employer], [employer]   Lot: [id number]   NDC: [address]-1

## 2020-01-17 ENCOUNTER — Ambulatory Visit: Payer: BC Managed Care – PPO | Admitting: Family Medicine

## 2020-01-17 ENCOUNTER — Other Ambulatory Visit: Payer: Self-pay

## 2020-01-17 ENCOUNTER — Encounter: Payer: Self-pay | Admitting: Family Medicine

## 2020-01-17 VITALS — BP 108/76 | HR 68 | Temp 98.1°F | Ht 62.0 in | Wt 125.5 lb

## 2020-01-17 DIAGNOSIS — K219 Gastro-esophageal reflux disease without esophagitis: Secondary | ICD-10-CM

## 2020-01-17 DIAGNOSIS — M545 Low back pain, unspecified: Secondary | ICD-10-CM

## 2020-01-17 DIAGNOSIS — G8929 Other chronic pain: Secondary | ICD-10-CM | POA: Diagnosis not present

## 2020-01-17 MED ORDER — PANTOPRAZOLE SODIUM 40 MG PO TBEC: 40.0000 mg | DELAYED_RELEASE_TABLET | Freq: Every day | ORAL | 1 refills | Status: DC

## 2020-01-17 NOTE — Progress Notes (Signed)
Musculoskeletal Exam  Patient: Paul Cabrera DOB: Jul 18, 1980  DOS: 01/17/2020  SUBJECTIVE:  Chief Complaint:   Chief Complaint  Patient presents with   Follow-up   Hip Pain    right    Paul Cabrera is a 39 y.o.  male for evaluation and treatment of R hip pain.  He is here with a friend who helps interpret.  Onset: several mo ago. No inj or change in activity.  Location: R outer hip/upper back area Character:  dull  Progression of issue:  Worsened over past few weeks Associated symptoms: no bruising, redness, swelling, loss of control of bowel/bladder function Treatment: to date has been none.   Neurovascular symptoms: no  +hx of bloating and early satiety. Has been referred to GI in past but did not go. No weight changes or bleeding. No bowel changes or fevers. He was rx'd Protonix that did help 75%, but he stopped taking it. Tested neg for H pylori x2.   Past Medical History:  Diagnosis Date   Bradycardia     Objective: VITAL SIGNS: BP 108/76 (BP Location: Right Arm, Patient Position: Sitting, Cuff Size: Normal)    Pulse 68    Temp 98.1 F (36.7 C) (Oral)    Ht 5\' 2"  (1.575 m)    Wt 125 lb 8 oz (56.9 kg)    SpO2 100%    BMI 22.95 kg/m  Constitutional: Well formed, well developed. No acute distress. Cardiovascular: RRR Thorax & Lungs: No accessory muscle use Abd: BS+, S, Nt, ND Musculoskeletal: R hip and LB.   Normal active range of motion: yes.   Normal passive range of motion: yes Tenderness to palpation: mild ttp over the proximal insertion of the right erector spinae muscle group Deformity: no Ecchymosis: no Tests positive: None Tests negative: Straight leg, logroll, Stinchfield, FADDIR, FABER Neurologic: Normal sensory function. No focal deficits noted. DTR's equal and symmetric in LE's. No clonus. Psychiatric: Normal mood. Age appropriate judgment and insight. Alert & oriented x 3.    Assessment:  Gastroesophageal reflux disease without esophagitis - Plan:  pantoprazole (PROTONIX) 40 MG tablet  Chronic right-sided low back pain without sciatica  Plan: 1.  Restart Protonix.  Trial for 2 full months.  If still having issues at follow-up, will refer to gastroenterology.    2. Stretches/exercises, heat, ice, Tylenol.  If no improvement over the next month, will refer to physical therapy. The patient and his friend voiced understanding and agreement to the plan.   [place], DO 01/17/20  4:19 PM

## 2020-01-17 NOTE — Patient Instructions (Addendum)
Please take the medicine daily for 2 months and we will reconvene.   Heat (pad or rice pillow in microwave) over affected area, 10-15 minutes twice daily.   Ice/cold pack over area for 10-15 min twice daily.  OK to take Tylenol 1000 mg (2 extra strength tabs) or 975 mg (3 regular strength tabs) every 6 hours as needed.  Try to avoid ibuprofen if possible as it can affect your stomach.   Send me a message in 1 month if back is no better and we will set you up with the physical therapy team.   EXERCISES  RANGE OF MOTION (ROM) AND STRETCHING EXERCISES - Low Back Pain Most people with lower back pain will find that their symptoms get worse with excessive bending forward (flexion) or arching at the lower back (extension). The exercises that will help resolve your symptoms will focus on the opposite motion.  If you have pain, numbness or tingling which travels down into your buttocks, leg or foot, the goal of the therapy is for these symptoms to move closer to your back and eventually resolve. Sometimes, these leg symptoms will get better, but your lower back pain may worsen. This is often an indication of progress in your rehabilitation. Be very alert to any changes in your symptoms and the activities in which you participated in the 24 hours prior to the change. Sharing this information with your caregiver will allow him or her to most efficiently treat your condition. These exercises may help you when beginning to rehabilitate your injury. Your symptoms may resolve with or without further involvement from your physician, physical therapist or athletic trainer. While completing these exercises, remember:   Restoring tissue flexibility helps normal motion to return to the joints. This allows healthier, less painful movement and activity.  An effective stretch should be held for at least 30 seconds.  A stretch should never be painful. You should only feel a gentle lengthening or release in the  stretched tissue. FLEXION RANGE OF MOTION AND STRETCHING EXERCISES:  STRETCH - Flexion, Single Knee to Chest   Lie on a firm bed or floor with both legs extended in front of you.  Keeping one leg in contact with the floor, bring your opposite knee to your chest. Hold your leg in place by either grabbing behind your thigh or at your knee.  Pull until you feel a gentle stretch in your low back. Hold 30 seconds.  Slowly release your grasp and repeat the exercise with the opposite side. Repeat 2 times. Complete this exercise 3 times per week.   STRETCH - Flexion, Double Knee to Chest  Lie on a firm bed or floor with both legs extended in front of you.  Keeping one leg in contact with the floor, bring your opposite knee to your chest.  Tense your stomach muscles to support your back and then lift your other knee to your chest. Hold your legs in place by either grabbing behind your thighs or at your knees.  Pull both knees toward your chest until you feel a gentle stretch in your low back. Hold 30 seconds.  Tense your stomach muscles and slowly return one leg at a time to the floor. Repeat 2 times. Complete this exercise 3 times per week.   STRETCH - Low Trunk Rotation  Lie on a firm bed or floor. Keeping your legs in front of you, bend your knees so they are both pointed toward the ceiling and your feet are flat  on the floor.  Extend your arms out to the side. This will stabilize your upper body by keeping your shoulders in contact with the floor.  Gently and slowly drop both knees together to one side until you feel a gentle stretch in your low back. Hold for 30 seconds.  Tense your stomach muscles to support your lower back as you bring your knees back to the starting position. Repeat the exercise to the other side. Repeat 2 times. Complete this exercise at least 3 times per week.   EXTENSION RANGE OF MOTION AND FLEXIBILITY EXERCISES:  STRETCH - Extension, Prone on Elbows   Lie  on your stomach on the floor, a bed will be too soft. Place your palms about shoulder width apart and at the height of your head.  Place your elbows under your shoulders. If this is too painful, stack pillows under your chest.  Allow your body to relax so that your hips drop lower and make contact more completely with the floor.  Hold this position for 30 seconds.  Slowly return to lying flat on the floor. Repeat 2 times. Complete this exercise 3 times per week.   RANGE OF MOTION - Extension, Prone Press Ups  Lie on your stomach on the floor, a bed will be too soft. Place your palms about shoulder width apart and at the height of your head.  Keeping your back as relaxed as possible, slowly straighten your elbows while keeping your hips on the floor. You may adjust the placement of your hands to maximize your comfort. As you gain motion, your hands will come more underneath your shoulders.  Hold this position 30 seconds.  Slowly return to lying flat on the floor. Repeat 2 times. Complete this exercise 3 times per week.   RANGE OF MOTION- Quadruped, Neutral Spine   Assume a hands and knees position on a firm surface. Keep your hands under your shoulders and your knees under your hips. You may place padding under your knees for comfort.  Drop your head and point your tailbone toward the ground below you. This will round out your lower back like an angry cat. Hold this position for 30 seconds.  Slowly lift your head and release your tail bone so that your back sags into a large arch, like an old horse.  Hold this position for 30 seconds.  Repeat this until you feel limber in your low back.  Now, find your "sweet spot." This will be the most comfortable position somewhere between the two previous positions. This is your neutral spine. Once you have found this position, tense your stomach muscles to support your low back.  Hold this position for 30 seconds. Repeat 2 times. Complete  this exercise 3 times per week.   STRENGTHENING EXERCISES - Low Back Sprain These exercises may help you when beginning to rehabilitate your injury. These exercises should be done near your "sweet spot." This is the neutral, low-back arch, somewhere between fully rounded and fully arched, that is your least painful position. When performed in this safe range of motion, these exercises can be used for people who have either a flexion or extension based injury. These exercises may resolve your symptoms with or without further involvement from your physician, physical therapist or athletic trainer. While completing these exercises, remember:   Muscles can gain both the endurance and the strength needed for everyday activities through controlled exercises.  Complete these exercises as instructed by your physician, physical therapist or athletic  trainer. Increase the resistance and repetitions only as guided.  You may experience muscle soreness or fatigue, but the pain or discomfort you are trying to eliminate should never worsen during these exercises. If this pain does worsen, stop and make certain you are following the directions exactly. If the pain is still present after adjustments, discontinue the exercise until you can discuss the trouble with your caregiver.  STRENGTHENING - Deep Abdominals, Pelvic Tilt   Lie on a firm bed or floor. Keeping your legs in front of you, bend your knees so they are both pointed toward the ceiling and your feet are flat on the floor.  Tense your lower abdominal muscles to press your low back into the floor. This motion will rotate your pelvis so that your tail bone is scooping upwards rather than pointing at your feet or into the floor. With a gentle tension and even breathing, hold this position for 3 seconds. Repeat 2 times. Complete this exercise 3 times per week.   STRENGTHENING - Abdominals, Crunches   Lie on a firm bed or floor. Keeping your legs in front  of you, bend your knees so they are both pointed toward the ceiling and your feet are flat on the floor. Cross your arms over your chest.  Slightly tip your chin down without bending your neck.  Tense your abdominals and slowly lift your trunk high enough to just clear your shoulder blades. Lifting higher can put excessive stress on the lower back and does not further strengthen your abdominal muscles.  Control your return to the starting position. Repeat 2 times. Complete this exercise 3 times per week.   STRENGTHENING - Quadruped, Opposite UE/LE Lift   Assume a hands and knees position on a firm surface. Keep your hands under your shoulders and your knees under your hips. You may place padding under your knees for comfort.  Find your neutral spine and gently tense your abdominal muscles so that you can maintain this position. Your shoulders and hips should form a rectangle that is parallel with the floor and is not twisted.  Keeping your trunk steady, lift your right hand no higher than your shoulder and then your left leg no higher than your hip. Make sure you are not holding your breath. Hold this position for 30 seconds.  Continuing to keep your abdominal muscles tense and your back steady, slowly return to your starting position. Repeat with the opposite arm and leg. Repeat 2 times. Complete this exercise 3 times per week.   STRENGTHENING - Abdominals and Quadriceps, Straight Leg Raise   Lie on a firm bed or floor with both legs extended in front of you.  Keeping one leg in contact with the floor, bend the other knee so that your foot can rest flat on the floor.  Find your neutral spine, and tense your abdominal muscles to maintain your spinal position throughout the exercise.  Slowly lift your straight leg off the floor about 6 inches for a count of 3, making sure to not hold your breath.  Still keeping your neutral spine, slowly lower your leg all the way to the floor. Repeat  this exercise with each leg 2 times. Complete this exercise 3 times per week.  POSTURE AND BODY MECHANICS CONSIDERATIONS - Low Back Sprain Keeping correct posture when sitting, standing or completing your activities will reduce the stress put on different body tissues, allowing injured tissues a chance to heal and limiting painful experiences. The following are general  guidelines for improved posture.  While reading these guidelines, remember:  The exercises prescribed by your provider will help you have the flexibility and strength to maintain correct postures.  The correct posture provides the best environment for your joints to work. All of your joints have less wear and tear when properly supported by a spine with good posture. This means you will experience a healthier, less painful body.  Correct posture must be practiced with all of your activities, especially prolonged sitting and standing. Correct posture is as important when doing repetitive low-stress activities (typing) as it is when doing a single heavy-load activity (lifting).  RESTING POSITIONS Consider which positions are most painful for you when choosing a resting position. If you have pain with flexion-based activities (sitting, bending, stooping, squatting), choose a position that allows you to rest in a less flexed posture. You would want to avoid curling into a fetal position on your side. If your pain worsens with extension-based activities (prolonged standing, working overhead), avoid resting in an extended position such as sleeping on your stomach. Most people will find more comfort when they rest with their spine in a more neutral position, neither too rounded nor too arched. Lying on a non-sagging bed on your side with a pillow between your knees, or on your back with a pillow under your knees will often provide some relief. Keep in mind, being in any one position for a prolonged period of time, no matter how correct your  posture, can still lead to stiffness.  PROPER SITTING POSTURE In order to minimize stress and discomfort on your spine, you must sit with correct posture. Sitting with good posture should be effortless for a healthy body. Returning to good posture is a gradual process. Many people can work toward this most comfortably by using various supports until they have the flexibility and strength to maintain this posture on their own. When sitting with proper posture, your ears will fall over your shoulders and your shoulders will fall over your hips. You should use the back of the chair to support your upper back. Your lower back will be in a neutral position, just slightly arched. You may place a small pillow or folded towel at the base of your lower back for  support.  When working at a desk, create an environment that supports good, upright posture. Without extra support, muscles tire, which leads to excessive strain on joints and other tissues. Keep these recommendations in mind:  CHAIR:  A chair should be able to slide under your desk when your back makes contact with the back of the chair. This allows you to work closely.  The chair's height should allow your eyes to be level with the upper part of your monitor and your hands to be slightly lower than your elbows.  BODY POSITION  Your feet should make contact with the floor. If this is not possible, use a foot rest.  Keep your ears over your shoulders. This will reduce stress on your neck and low back.  INCORRECT SITTING POSTURES  If you are feeling tired and unable to assume a healthy sitting posture, do not slouch or slump. This puts excessive strain on your back tissues, causing more damage and pain. Healthier options include:  Using more support, like a lumbar pillow.  Switching tasks to something that requires you to be upright or walking.  Talking a brief walk.  Lying down to rest in a neutral-spine position.  PROLONGED STANDING  WHILE SLIGHTLY  LEANING FORWARD  When completing a task that requires you to lean forward while standing in one place for a long time, place either foot up on a stationary 2-4 inch high object to help maintain the best posture. When both feet are on the ground, the lower back tends to lose its slight inward curve. If this curve flattens (or becomes too large), then the back and your other joints will experience too much stress, tire more quickly, and can cause pain.  CORRECT STANDING POSTURES Proper standing posture should be assumed with all daily activities, even if they only take a few moments, like when brushing your teeth. As in sitting, your ears should fall over your shoulders and your shoulders should fall over your hips. You should keep a slight tension in your abdominal muscles to brace your spine. Your tailbone should point down to the ground, not behind your body, resulting in an over-extended swayback posture.   INCORRECT STANDING POSTURES  Common incorrect standing postures include a forward head, locked knees and/or an excessive swayback. WALKING Walk with an upright posture. Your ears, shoulders and hips should all line-up.  PROLONGED ACTIVITY IN A FLEXED POSITION When completing a task that requires you to bend forward at your waist or lean over a low surface, try to find a way to stabilize 3 out of 4 of your limbs. You can place a hand or elbow on your thigh or rest a knee on the surface you are reaching across. This will provide you more stability, so that your muscles do not tire as quickly. By keeping your knees relaxed, or slightly bent, you will also reduce stress across your lower back. CORRECT LIFTING TECHNIQUES  DO :  Assume a wide stance. This will provide you more stability and the opportunity to get as close as possible to the object which you are lifting.  Tense your abdominals to brace your spine. Bend at the knees and hips. Keeping your back locked in a neutral-spine  position, lift using your leg muscles. Lift with your legs, keeping your back straight.  Test the weight of unknown objects before attempting to lift them.  Try to keep your elbows locked down at your sides in order get the best strength from your shoulders when carrying an object.     Always ask for help when lifting heavy or awkward objects. INCORRECT LIFTING TECHNIQUES DO NOT:   Lock your knees when lifting, even if it is a small object.  Bend and twist. Pivot at your feet or move your feet when needing to change directions.  Assume that you can safely pick up even a paperclip without proper posture.

## 2020-03-06 ENCOUNTER — Encounter: Payer: BC Managed Care – PPO | Admitting: Family Medicine

## 2020-06-03 ENCOUNTER — Ambulatory Visit (INDEPENDENT_AMBULATORY_CARE_PROVIDER_SITE_OTHER): Payer: BC Managed Care – PPO | Admitting: Family Medicine

## 2020-06-03 ENCOUNTER — Encounter: Payer: Self-pay | Admitting: Family Medicine

## 2020-06-03 ENCOUNTER — Other Ambulatory Visit: Payer: Self-pay

## 2020-06-03 VITALS — BP 120/68 | HR 58 | Temp 98.3°F | Ht 62.0 in | Wt 124.0 lb

## 2020-06-03 DIAGNOSIS — Z789 Other specified health status: Secondary | ICD-10-CM | POA: Diagnosis not present

## 2020-06-03 DIAGNOSIS — Z0000 Encounter for general adult medical examination without abnormal findings: Secondary | ICD-10-CM

## 2020-06-03 DIAGNOSIS — R5383 Other fatigue: Secondary | ICD-10-CM | POA: Diagnosis not present

## 2020-06-03 DIAGNOSIS — K219 Gastro-esophageal reflux disease without esophagitis: Secondary | ICD-10-CM | POA: Diagnosis not present

## 2020-06-03 MED ORDER — PANTOPRAZOLE SODIUM 40 MG PO TBEC: 40.0000 mg | DELAYED_RELEASE_TABLET | Freq: Every day | ORAL | 11 refills | Status: DC

## 2020-06-03 NOTE — Patient Instructions (Signed)
Give us 2-3 business days to get the results of your labs back.   Keep the diet clean and stay active.  Do monthly self testicular checks in the shower. You are feeling for lumps/bumps that don't belong. If you feel anything like this, let me know!  Let us know if you need anything. 

## 2020-06-03 NOTE — Progress Notes (Addendum)
Chief Complaint  Patient presents with  . Annual Exam    Well Male Paul Cabrera is here for a complete physical. Here w a friend who helps interpret.  His last physical was >1 year ago.  Current diet: in general, a "healthy" diet.   Current exercise: lifting weights Weight trend: stable Fatigue out of ordinary? No. Seat belt? Yes.    Health maintenance Tetanus- Yes HIV- No Hep C- Yes  Past Medical History:  Diagnosis Date  . GERD (gastroesophageal reflux disease)      Past Surgical History:  Procedure Laterality Date  . NO PAST SURGERIES      Medications  Current Outpatient Medications on File Prior to Visit  Medication Sig Dispense Refill  . pantoprazole (PROTONIX) 40 MG tablet Take 1 tablet (40 mg total) by mouth daily. 30 tablet 1   Allergies No Known Allergies  Family History Family History  Family history unknown: Yes    Review of Systems: Constitutional: no fevers or chills Eye:  no recent significant change in vision Ear/Nose/Mouth/Throat:  Ears:  no hearing loss Nose/Mouth/Throat:  no complaints of nasal congestion, no sore throat Cardiovascular:  no chest pain Respiratory:  No coughing, will get sob when working out only Gastrointestinal:  no abdominal pain, no change in bowel habits GU:  Male: negative for dysuria Musculoskeletal/Extremities:  no pain of the joints Integumentary (Skin/Breast):  no abnormal skin lesions reported Neurologic:  no headaches Endocrine: No unexpected weight changes Hematologic/Lymphatic:  no night sweats  Exam BP 120/68 (BP Location: Left Arm, Patient Position: Sitting, Cuff Size: Normal)   Pulse (!) 58   Temp 98.3 F (36.8 C) (Oral)   Ht 5\' 2"  (1.575 m)   Wt 124 lb (56.2 kg)   SpO2 97%   BMI 22.68 kg/m  General:  well developed, well nourished, in no apparent distress Skin:  no significant moles, warts, or growths Head:  no masses, lesions, or tenderness Eyes:  pupils equal and round, sclera anicteric without  injection Ears:  canals without lesions, TMs shiny without retraction, no obvious effusion, no erythema Nose:  nares patent, septum midline, mucosa normal Throat/Pharynx:  lips and gingiva without lesion; tongue and uvula midline; non-inflamed pharynx; no exudates or postnasal drainage Neck: neck supple without adenopathy, thyromegaly, or masses Lungs:  clear to auscultation, breath sounds equal bilaterally, no respiratory distress Cardio:  regular rate and rhythm, no bruits, no LE edema Abdomen:  abdomen soft, nontender; bowel sounds normal; no masses or organomegaly Genital (male): Deferred Rectal: Deferred Musculoskeletal:  symmetrical muscle groups noted without atrophy or deformity Extremities:  no clubbing, cyanosis, or edema, no deformities, no skin discoloration Neuro:  gait normal; deep tendon reflexes normal and symmetric Psych: well oriented with normal range of affect and appropriate judgment/insight  Assessment and Plan  Well adult exam - Plan: Lipid panel, Comprehensive metabolic panel  Vegetarian diet - Plan: B12  Fatigue, unspecified type - Plan: VITAMIN D 25 Hydroxy (Vit-D Deficiency, Fractures), CBC  Gastroesophageal reflux disease without esophagitis - Plan: pantoprazole (PROTONIX) 40 MG tablet   Well 39 y.o. male. Counseled on diet and exercise. Self testicular exams recommended at least monthly.  Other orders as above. Follow up in 1 year pending the above workup. The patient and his friend voiced understanding and agreement to the plan.  [address] [place], DO 06/03/20 3:42 PM

## 2020-06-04 ENCOUNTER — Other Ambulatory Visit: Payer: Self-pay | Admitting: Family Medicine

## 2020-06-04 DIAGNOSIS — E538 Deficiency of other specified B group vitamins: Secondary | ICD-10-CM

## 2020-06-04 DIAGNOSIS — E785 Hyperlipidemia, unspecified: Secondary | ICD-10-CM

## 2020-06-04 DIAGNOSIS — E559 Vitamin D deficiency, unspecified: Secondary | ICD-10-CM

## 2020-06-04 LAB — LIPID PANEL
Cholesterol: 207 mg/dL — ABNORMAL HIGH (ref 0–200)
HDL: 48.9 mg/dL (ref >39.00)
NonHDL: 158.34
Total CHOL/HDL Ratio: 4
Triglycerides: 350 mg/dL — ABNORMAL HIGH (ref 0.0–149.0)
VLDL: 70 mg/dL — ABNORMAL HIGH (ref 0.0–40.0)

## 2020-06-04 LAB — COMPREHENSIVE METABOLIC PANEL
ALT: 26 U/L (ref 0–53)
AST: 24 U/L (ref 0–37)
Albumin: 4.6 g/dL (ref 3.5–5.2)
Alkaline Phosphatase: 41 U/L (ref 39–117)
BUN: 9 mg/dL (ref 6–23)
CO2: 33 mEq/L — ABNORMAL HIGH (ref 19–32)
Calcium: 9.4 mg/dL (ref 8.4–10.5)
Chloride: 101 mEq/L (ref 96–112)
Creatinine, Ser: 0.83 mg/dL (ref 0.40–1.50)
GFR: 109.94 mL/min (ref >60.00)
Glucose, Bld: 90 mg/dL (ref 70–99)
Potassium: 4.5 mEq/L (ref 3.5–5.1)
Sodium: 140 mEq/L (ref 135–145)
Total Bilirubin: 1.1 mg/dL (ref 0.2–1.2)
Total Protein: 7.4 g/dL (ref 6.0–8.3)

## 2020-06-04 LAB — CBC
HCT: 46.8 % (ref 39.0–52.0)
Hemoglobin: 16 g/dL (ref 13.0–17.0)
MCHC: 34.2 g/dL (ref 30.0–36.0)
MCV: 92.4 fl (ref 78.0–100.0)
Platelets: 267 10*3/uL (ref 150.0–400.0)
RBC: 5.06 Mil/uL (ref 4.22–5.81)
RDW: 12.7 % (ref 11.5–15.5)
WBC: 5.7 10*3/uL (ref 4.0–10.5)

## 2020-06-04 LAB — VITAMIN B12: Vitamin B-12: 145 pg/mL — ABNORMAL LOW (ref 211–911)

## 2020-06-04 LAB — VITAMIN D 25 HYDROXY (VIT D DEFICIENCY, FRACTURES): VITD: 8.91 ng/mL — ABNORMAL LOW (ref 30.00–100.00)

## 2020-06-04 LAB — LDL CHOLESTEROL, DIRECT: Direct LDL: 115 mg/dL

## 2020-06-05 ENCOUNTER — Other Ambulatory Visit: Payer: Self-pay | Admitting: Family Medicine

## 2020-06-05 DIAGNOSIS — R5383 Other fatigue: Secondary | ICD-10-CM

## 2020-06-05 MED ORDER — VITAMIN D (ERGOCALCIFEROL) 1.25 MG (50000 UNIT) PO CAPS: 50000.0000 [IU] | ORAL_CAPSULE | ORAL | 0 refills | Status: DC

## 2020-06-07 NOTE — Telephone Encounter (Signed)
Future TSH order placed. Mychart message sent to pt to schedule lab appt.

## 2020-10-16 ENCOUNTER — Other Ambulatory Visit: Payer: BC Managed Care – PPO

## 2020-11-06 ENCOUNTER — Other Ambulatory Visit (INDEPENDENT_AMBULATORY_CARE_PROVIDER_SITE_OTHER): Payer: BC Managed Care – PPO

## 2020-11-06 ENCOUNTER — Other Ambulatory Visit: Payer: Self-pay

## 2020-11-06 ENCOUNTER — Other Ambulatory Visit: Payer: BC Managed Care – PPO

## 2020-11-06 DIAGNOSIS — E785 Hyperlipidemia, unspecified: Secondary | ICD-10-CM | POA: Diagnosis not present

## 2020-11-06 DIAGNOSIS — E559 Vitamin D deficiency, unspecified: Secondary | ICD-10-CM

## 2020-11-06 DIAGNOSIS — R5383 Other fatigue: Secondary | ICD-10-CM | POA: Diagnosis not present

## 2020-11-06 DIAGNOSIS — E538 Deficiency of other specified B group vitamins: Secondary | ICD-10-CM | POA: Diagnosis not present

## 2020-11-07 ENCOUNTER — Other Ambulatory Visit: Payer: Self-pay | Admitting: Family Medicine

## 2020-11-07 LAB — LIPID PANEL
Cholesterol: 196 mg/dL (ref 0–200)
HDL: 46.7 mg/dL (ref >39.00)
NonHDL: 148.88
Total CHOL/HDL Ratio: 4
Triglycerides: 226 mg/dL — ABNORMAL HIGH (ref 0.0–149.0)
VLDL: 45.2 mg/dL — ABNORMAL HIGH (ref 0.0–40.0)

## 2020-11-07 LAB — VITAMIN D 25 HYDROXY (VIT D DEFICIENCY, FRACTURES): VITD: 24.06 ng/mL — ABNORMAL LOW (ref 30.00–100.00)

## 2020-11-07 LAB — LDL CHOLESTEROL, DIRECT: Direct LDL: 115 mg/dL

## 2020-11-07 LAB — TSH: TSH: 1.98 u[IU]/mL (ref 0.35–4.50)

## 2020-11-07 LAB — VITAMIN B12: Vitamin B-12: 277 pg/mL (ref 211–911)

## 2020-11-07 MED ORDER — VITAMIN D (ERGOCALCIFEROL) 1.25 MG (50000 UNIT) PO CAPS: 50000.0000 [IU] | ORAL_CAPSULE | ORAL | 0 refills | Status: DC

## 2020-11-08 ENCOUNTER — Other Ambulatory Visit: Payer: Self-pay | Admitting: Family Medicine

## 2020-11-08 DIAGNOSIS — E538 Deficiency of other specified B group vitamins: Secondary | ICD-10-CM

## 2020-11-08 DIAGNOSIS — E785 Hyperlipidemia, unspecified: Secondary | ICD-10-CM

## 2020-11-08 DIAGNOSIS — E559 Vitamin D deficiency, unspecified: Secondary | ICD-10-CM

## 2020-11-11 ENCOUNTER — Telehealth: Payer: Self-pay | Admitting: Family Medicine

## 2020-11-11 ENCOUNTER — Other Ambulatory Visit: Payer: Self-pay | Admitting: Family Medicine

## 2020-11-11 DIAGNOSIS — E559 Vitamin D deficiency, unspecified: Secondary | ICD-10-CM

## 2020-11-11 DIAGNOSIS — E785 Hyperlipidemia, unspecified: Secondary | ICD-10-CM

## 2020-11-11 DIAGNOSIS — E538 Deficiency of other specified B group vitamins: Secondary | ICD-10-CM

## 2020-11-11 NOTE — Telephone Encounter (Signed)
See result notes. 

## 2020-11-11 NOTE — Telephone Encounter (Signed)
States he got your message and pick up the medication

## 2020-11-15 ENCOUNTER — Telehealth (INDEPENDENT_AMBULATORY_CARE_PROVIDER_SITE_OTHER): Payer: BC Managed Care – PPO | Admitting: Family Medicine

## 2020-11-15 ENCOUNTER — Other Ambulatory Visit: Payer: Self-pay

## 2020-11-15 ENCOUNTER — Encounter: Payer: Self-pay | Admitting: Family Medicine

## 2020-11-15 DIAGNOSIS — G44209 Tension-type headache, unspecified, not intractable: Secondary | ICD-10-CM | POA: Diagnosis not present

## 2020-11-15 MED ORDER — SUMATRIPTAN SUCCINATE 100 MG PO TABS: 100.0000 mg | ORAL_TABLET | ORAL | 0 refills | Status: DC | PRN

## 2020-11-15 NOTE — Progress Notes (Signed)
Chief Complaint  Patient presents with  . Headache     Paul Cabrera is a 40 y.o. male here for evaluation of an acute headache. Due to COVID-19 pandemic, we are interacting via web portal for an electronic face-to-face visit. I verified patient's ID using 2 identifiers. Patient agreed to proceed with visit via this method. Patient is at home, I am at home. Patient, his friend and I are present for visit. His friend helps interpret   Duration: 5 days Laterality: right Quality: stabbing, aching Severity: 6/10 Associated symptoms: none Therapies tried: Tylenol, ibuprofen, gabapentin Hx of migraines: No.  Past Medical History:  Diagnosis Date  . GERD (gastroesophageal reflux disease)     Allergies as of 11/15/2020   No Known Allergies     Medication List       Accurate as of Nov 15, 2020  8:48 AM. If you have any questions, ask your nurse or doctor.        pantoprazole 40 MG tablet Commonly known as: PROTONIX Take 1 tablet (40 mg total) by mouth daily.   SUMAtriptan 100 MG tablet Commonly known as: Imitrex Take 1 tablet (100 mg total) by mouth every 2 (two) hours as needed for migraine. May repeat in 2 hours if headache persists or recurs. Started by: Sharlene Dory, DO   Vitamin D (Ergocalciferol) 1.25 MG (50000 UNIT) Caps capsule Commonly known as: DRISDOL Take 1 capsule (50,000 Units total) by mouth every 7 (seven) days.      Objective No conversational dyspnea Age appropriate judgment and insight Nml affect and mood  Acute non intractable tension-type headache - Plan: SUMAtriptan (IMITREX) 100 MG tablet  Failed ibuprofen, Tylenol, gabapentin. Trial triptan. He will come in to office for Toradol injection.  F/u prn. The pt and friend voiced understanding and agreement to the plan.  Jilda Roche [place], DO 11/15/20 8:48 AM

## 2020-11-19 ENCOUNTER — Ambulatory Visit: Payer: BC Managed Care – PPO | Admitting: Family Medicine

## 2020-11-28 DIAGNOSIS — Z20822 Contact with and (suspected) exposure to covid-19: Secondary | ICD-10-CM | POA: Diagnosis not present

## 2021-06-24 ENCOUNTER — Encounter: Payer: Self-pay | Admitting: Medical

## 2021-06-24 ENCOUNTER — Telehealth: Payer: Self-pay | Admitting: Medical

## 2021-06-24 ENCOUNTER — Telehealth (INDEPENDENT_AMBULATORY_CARE_PROVIDER_SITE_OTHER): Payer: BC Managed Care – PPO | Admitting: Medical

## 2021-06-24 DIAGNOSIS — R059 Cough, unspecified: Secondary | ICD-10-CM

## 2021-06-24 DIAGNOSIS — B349 Viral infection, unspecified: Secondary | ICD-10-CM | POA: Diagnosis not present

## 2021-06-24 MED ORDER — HYDROCODONE BIT-HOMATROP MBR 5-1.5 MG/5ML PO SOLN: 5.0000 mL | Freq: Three times a day (TID) | ORAL | 0 refills | Status: DC | PRN

## 2021-06-24 MED ORDER — FLUTICASONE PROPIONATE 50 MCG/ACT NA SUSP: 2.0000 | Freq: Every day | NASAL | 1 refills | Status: DC

## 2021-06-24 MED ORDER — ALBUTEROL SULFATE HFA 108 (90 BASE) MCG/ACT IN AERS: 2.0000 | INHALATION_SPRAY | Freq: Four times a day (QID) | RESPIRATORY_TRACT | 0 refills | Status: DC | PRN

## 2021-06-24 NOTE — Progress Notes (Signed)
° °  Subjective:    Patient ID: Paul Cabrera, male    DOB: [birth year], 40 y.o.   MRN: [id number]  HPI  Virtual Visit via Video Note  I connected with Paul Cabrera on 06/24/21 at  2:00 PM EST by a video enabled telemedicine application and verified that I am speaking with the correct person using two identifiers.  Location: Patient: home Provider: office   I discussed the limitations of evaluation and management by telemedicine and the availability of in person appointments. The patient expressed understanding and agreed to proceed.  History of Present Illness: Pt has been sick since wed night.   Chills, fever, coughing, hoare voice, myalgia and fatigue. No wheezing or sob.  Pt has not taken a covid yet.  Pt had 3 covid vaccines.   Dry cough.  Taking mucinex twice a day.  Muscle aches went away by Friday.  His risk score is low.  Pt refuses to do covid test.  Pt is not wheezing. No hx of smoking. No hx of asthma.   Observations/Objective: General-no acute distress, pleasant, oriented. Lungs- on inspection lungs appear unlabored. Neck- no tracheal deviation or jvd on inspection. Neuro- gross motor function appears intact.   Assessment and Plan:  Patient Instructions  5 days ago started with viral syndrome as explained in HPI.  Now mostly having just persistent dry cough and mild fatigue.  Patient never got evaluated some flu test was not done.  He has refused to do COVID test over-the-counter.   I think is a good idea to go ahead and get chest x-ray but patient and family member do not want to do that just yet.  Explained getting x-ray now would be of benefit to see if any bronchitis or pneumonia present.  They can get a chest x-ray later this week or early next week if not improving.  Prescribe Hycodan cough syrup.  Rx advisement given.  Prescribing Flonase for nasal congestion and making albuterol available if patient has any wheezing.  Follow-up in 7 days or sooner if  needed.    Follow Up Instructions:    I discussed the assessment and treatment plan with the patient. The patient was provided an opportunity to ask questions and all were answered. The patient agreed with the plan and demonstrated an understanding of the instructions.   The patient was advised to call back or seek an in-person evaluation if the symptoms worsen or if the condition fails to improve as anticipated.  Time spent with patient today was  20 minutes which consisted of chart revdiew, discussing diagnosis, work up treatment and documentation.    Esperanza Richters, PA-C   Review of Systems  Constitutional:  Negative for chills, fatigue and fever.  HENT:  Positive for congestion.   Respiratory:  Positive for cough. Negative for chest tightness, shortness of breath and wheezing.   Cardiovascular:  Negative for chest pain and palpitations.  Gastrointestinal:  Negative for abdominal pain.  Musculoskeletal:  Negative for arthralgias, back pain, myalgias and neck pain.  Neurological:  Negative for dizziness, syncope, weakness and headaches.  Hematological:  Negative for adenopathy. Does not bruise/bleed easily.  Psychiatric/Behavioral:  Negative for behavioral problems and confusion.       Objective:   Physical Exam        Assessment & Plan:

## 2021-06-24 NOTE — Telephone Encounter (Signed)
Did you cancel the [facility] presciption. Particularly the hycodan? Please let me know/confirm

## 2021-06-24 NOTE — Patient Instructions (Signed)
5 days ago started with viral syndrome as explained in HPI.  Now mostly having just persistent dry cough and mild fatigue.  Patient never got evaluated some flu test was not done.  He has refused to do COVID test over-the-counter.   I think is a good idea to go ahead and get chest x-ray but patient and family member do not want to do that just yet.  Explained getting x-ray now would be of benefit to see if any bronchitis or pneumonia present.  They can get a chest x-ray later this week or early next week if not improving.  Prescribe Hycodan cough syrup.  Rx advisement given.  Prescribing Flonase for nasal congestion and making albuterol available if patient has any wheezing.  Follow-up in 7 days or sooner if needed.

## 2021-06-25 NOTE — Telephone Encounter (Signed)
Yes script was cancelled yesterday

## 2021-08-06 ENCOUNTER — Encounter: Payer: Self-pay | Admitting: Family Medicine

## 2021-08-06 ENCOUNTER — Ambulatory Visit (INDEPENDENT_AMBULATORY_CARE_PROVIDER_SITE_OTHER): Payer: 59 | Admitting: Family Medicine

## 2021-08-06 ENCOUNTER — Encounter: Payer: BC Managed Care – PPO | Admitting: Family Medicine

## 2021-08-06 VITALS — BP 102/60 | HR 59 | Temp 98.0°F | Ht 63.0 in | Wt 126.1 lb

## 2021-08-06 DIAGNOSIS — Z789 Other specified health status: Secondary | ICD-10-CM | POA: Diagnosis not present

## 2021-08-06 DIAGNOSIS — E559 Vitamin D deficiency, unspecified: Secondary | ICD-10-CM

## 2021-08-06 DIAGNOSIS — Z0000 Encounter for general adult medical examination without abnormal findings: Secondary | ICD-10-CM

## 2021-08-06 NOTE — Progress Notes (Signed)
Chief Complaint  Patient presents with   Annual Exam    Well Male Paul Cabrera is here for a complete physical.  Here w friend who helps interpret ([place] His last physical was >1 year ago.  Current diet: in general, a "healthy" diet.   Current exercise: none Weight trend: stable Fatigue out of ordinary? No. Seat belt? Yes.   Advanced directive? No  Health maintenance Tetanus- Yes HIV- Yes Hep C- Yes  Past Medical History:  Diagnosis Date   GERD (gastroesophageal reflux disease)      Past Surgical History:  Procedure Laterality Date   NO PAST SURGERIES      Medications  Takes no meds routinely.    Allergies No Known Allergies  Family History Family History  Family history unknown: Yes    Review of Systems: Constitutional: no fevers or chills Eye:  no recent significant change in vision Ear/Nose/Mouth/Throat:  Ears:  no hearing loss Nose/Mouth/Throat:  no complaints of nasal congestion, no sore throat Cardiovascular:  no chest pain Respiratory:  no shortness of breath Gastrointestinal:  no abdominal pain, no change in bowel habits GU:  Male: negative for dysuria, frequency, and incontinence Musculoskeletal/Extremities:  no pain of the joints Integumentary (Skin/Breast):  no abnormal skin lesions reported Neurologic:  no headaches Endocrine: No unexpected weight changes Hematologic/Lymphatic:  no night sweats  Exam BP 102/60    Pulse (!) 59    Temp 98 F (36.7 C) (Oral)    Ht 5\' 3"  (1.6 m)    Wt 126 lb 2 oz (57.2 kg)    SpO2 98%    BMI 22.34 kg/m  General:  well developed, well nourished, in no apparent distress Skin:  no significant moles, warts, or growths Head:  no masses, lesions, or tenderness Eyes:  pupils equal and round, sclera anicteric without injection Ears:  canals without lesions, TMs shiny without retraction, no obvious effusion, no erythema Nose:  nares patent, septum midline, mucosa normal Throat/Pharynx:  lips and gingiva without  lesion; tongue and uvula midline; non-inflamed pharynx; no exudates or postnasal drainage Neck: neck supple without adenopathy, thyromegaly, or masses Lungs:  clear to auscultation, breath sounds equal bilaterally, no respiratory distress Cardio:  regular rate and rhythm, no bruits, no LE edema Abdomen:  abdomen soft, nontender; bowel sounds normal; no masses or organomegaly Rectal: Deferred Musculoskeletal:  symmetrical muscle groups noted without atrophy or deformity Extremities:  no clubbing, cyanosis, or edema, no deformities, no skin discoloration Neuro:  gait normal; deep tendon reflexes normal and symmetric Psych: well oriented with normal range of affect and appropriate judgment/insight  Assessment and Plan  Well adult exam - Plan: CBC, Comprehensive metabolic panel, Lipid panel  Vegetarian diet - Plan: B12  Vitamin D deficiency - Plan: VITAMIN D 25 Hydroxy (Vit-D Deficiency, Fractures)   Well 41 y.o. male. Counseled on diet and exercise. Counseled on risks and benefits of prostate cancer screening with PSA. The patient agrees to forego screening.  Bivalent COVID vaccination booster recommended.  Advanced directive form provided today.  Other orders as above. Follow up in 1 yr pending the above workup. The patient and his friend voiced understanding and agreement to the plan.  [address] [place], DO 08/06/21 2:33 PM

## 2021-08-06 NOTE — Patient Instructions (Addendum)
Give us 2-3 business days to get the results of your labs back.   Keep the diet clean and stay active.  I recommend getting the updated bivalent covid vaccination booster at your convenience.   Let us know if you need anything. 

## 2021-08-07 ENCOUNTER — Other Ambulatory Visit: Payer: Self-pay | Admitting: Family Medicine

## 2021-08-07 LAB — LIPID PANEL
Cholesterol: 184 mg/dL (ref 0–200)
HDL: 43.3 mg/dL (ref >39.00)
NonHDL: 141.1
Total CHOL/HDL Ratio: 4
Triglycerides: 267 mg/dL — ABNORMAL HIGH (ref 0.0–149.0)
VLDL: 53.4 mg/dL — ABNORMAL HIGH (ref 0.0–40.0)

## 2021-08-07 LAB — CBC
HCT: 45 % (ref 39.0–52.0)
Hemoglobin: 15.2 g/dL (ref 13.0–17.0)
MCHC: 33.8 g/dL (ref 30.0–36.0)
MCV: 92.6 fl (ref 78.0–100.0)
Platelets: 250 10*3/uL (ref 150.0–400.0)
RBC: 4.86 Mil/uL (ref 4.22–5.81)
RDW: 13 % (ref 11.5–15.5)
WBC: 4.4 10*3/uL (ref 4.0–10.5)

## 2021-08-07 LAB — COMPREHENSIVE METABOLIC PANEL
ALT: 18 U/L (ref 0–53)
AST: 18 U/L (ref 0–37)
Albumin: 4.6 g/dL (ref 3.5–5.2)
Alkaline Phosphatase: 37 U/L — ABNORMAL LOW (ref 39–117)
BUN: 10 mg/dL (ref 6–23)
CO2: 33 mEq/L — ABNORMAL HIGH (ref 19–32)
Calcium: 9.5 mg/dL (ref 8.4–10.5)
Chloride: 102 mEq/L (ref 96–112)
Creatinine, Ser: 0.86 mg/dL (ref 0.40–1.50)
GFR: 107.88 mL/min (ref >60.00)
Glucose, Bld: 97 mg/dL (ref 70–99)
Potassium: 4.3 mEq/L (ref 3.5–5.1)
Sodium: 141 mEq/L (ref 135–145)
Total Bilirubin: 1 mg/dL (ref 0.2–1.2)
Total Protein: 7.2 g/dL (ref 6.0–8.3)

## 2021-08-07 LAB — VITAMIN D 25 HYDROXY (VIT D DEFICIENCY, FRACTURES): VITD: 19.76 ng/mL — ABNORMAL LOW (ref 30.00–100.00)

## 2021-08-07 LAB — LDL CHOLESTEROL, DIRECT: Direct LDL: 108 mg/dL

## 2021-08-07 LAB — VITAMIN B12: Vitamin B-12: 225 pg/mL (ref 211–911)

## 2021-08-07 MED ORDER — VITAMIN D (ERGOCALCIFEROL) 1.25 MG (50000 UNIT) PO CAPS: 50000.0000 [IU] | ORAL_CAPSULE | ORAL | 0 refills | Status: DC

## 2021-08-08 ENCOUNTER — Other Ambulatory Visit: Payer: Self-pay | Admitting: Family Medicine

## 2021-08-08 ENCOUNTER — Encounter: Payer: Self-pay | Admitting: Family Medicine

## 2021-08-08 DIAGNOSIS — E785 Hyperlipidemia, unspecified: Secondary | ICD-10-CM

## 2021-08-08 DIAGNOSIS — E559 Vitamin D deficiency, unspecified: Secondary | ICD-10-CM

## 2021-08-08 NOTE — Telephone Encounter (Signed)
Appt made for May 3rd.

## 2021-08-20 ENCOUNTER — Encounter: Payer: Self-pay | Admitting: Family Medicine

## 2021-08-22 ENCOUNTER — Other Ambulatory Visit: Payer: Self-pay | Admitting: Family Medicine

## 2021-08-22 MED ORDER — GABAPENTIN 300 MG PO CAPS: 300.0000 mg | ORAL_CAPSULE | Freq: Three times a day (TID) | ORAL | 3 refills | Status: DC

## 2021-08-22 MED ORDER — KETOCONAZOLE 2 % EX CREA: 1.0000 "application " | TOPICAL_CREAM | Freq: Every day | CUTANEOUS | 0 refills | Status: AC

## 2021-11-05 ENCOUNTER — Other Ambulatory Visit: Payer: 59

## 2022-09-08 ENCOUNTER — Ambulatory Visit: Payer: Self-pay | Admitting: Family Medicine

## 2022-11-03 ENCOUNTER — Encounter: Payer: Self-pay | Admitting: Family Medicine

## 2023-11-05 ENCOUNTER — Encounter: Payer: Self-pay | Admitting: Family Medicine

## 2023-12-01 ENCOUNTER — Encounter: Payer: Self-pay | Admitting: Family Medicine

## 2024-07-07 ENCOUNTER — Ambulatory Visit (INDEPENDENT_AMBULATORY_CARE_PROVIDER_SITE_OTHER): Payer: Self-pay | Admitting: Student

## 2024-07-07 ENCOUNTER — Encounter: Payer: Self-pay | Admitting: Student

## 2024-07-07 VITALS — BP 145/91 | HR 62 | Temp 98.6°F | Resp 16 | Ht 63.0 in | Wt 134.4 lb

## 2024-07-07 DIAGNOSIS — R519 Headache, unspecified: Secondary | ICD-10-CM | POA: Insufficient documentation

## 2024-07-07 DIAGNOSIS — G43909 Migraine, unspecified, not intractable, without status migrainosus: Secondary | ICD-10-CM

## 2024-07-07 MED ORDER — SUMATRIPTAN SUCCINATE 100 MG PO TABS: 100.0000 mg | ORAL_TABLET | ORAL | 0 refills | Status: AC | PRN

## 2024-07-07 MED ORDER — IBUPROFEN 800 MG PO TABS: 800.0000 mg | ORAL_TABLET | Freq: Three times a day (TID) | ORAL | 0 refills | Status: DC | PRN

## 2024-07-07 NOTE — Progress Notes (Signed)
 Chief Complaint  Patient presents with   Headache    Patient has been having severe headaches, began yesterday. Right-side in the back of his head. Throbbing, severe pain.     History of Present Illness Paul Cabrera is a 44 year old male with a history of headaches who presents with a severe headache on the right side of his head. Presents with partner to OV. Offered interpreter services, pt declines. Partner translating for patient.  He has a severe headache on the back right side of his head that worsened since yesterday, reaching 10/10 last night. Denies head trauma, falls.  He took Imitrex  last night without relief, and Tylenol has not helped his severe episodes. He has light sensitivity but no nausea or vomiting.  A CT scan in 2017 was normal. He has been to the emergency room once in the last few years for headaches.   Denies fever, neck stiffness, visual changes, focal neurologic deficits, weakness, confusion, or head trauma. Denies photophobia, phonophobia, nausea/vomiting, sinus symptoms.  BP (!) 145/91 (BP Location: Left Arm, Patient Position: Sitting, Cuff Size: Normal)   Pulse 62   Temp 98.6 F (37 C) (Oral)   Resp 16   Ht 5' 3 (1.6 m)   Wt 134 lb 6.4 oz (61 kg)   SpO2 99%   BMI 23.81 kg/m  General: awake, alert, appearing stated age Eyes: PERRLA, EOMi Heart: RRR, no murmurs, no bruits Lungs: CTAB, no accessory muscle use Neuro: CN 2-12 intact, no cerebellar signs, DTR's equal and symmetry, no clonus MSK: 5/5 strength throughout, normal gait, no TTP over posterior cervical triangle or paraspinal cervical musculature Psych: Age appropriate judgment and insight, mood and affect normal  Migraine without status migrainosus, not intractable, unspecified migraine type - Plan: ibuprofen  (ADVIL ) 800 MG tablet, SUMAtriptan  (IMITREX ) 100 MG tablet, Ambulatory referral to Neurology   Migraine  No neurological symptoms.  - Refill Imitrex  prescription, instructed to take one  tablet immediately, second dose if no relief in two hours. - Recommended ibuprofen  600-800 mg as needed, max two doses per day. - Advised Tylenol as needed, staggered with ibuprofen  OTC prn - Referred to neurology for further evaluation and management, per Pt request - Advised journaling potential triggers such as food and hydration status. Encouraged increased hydration, 64 ounces of clear fluids daily. Minimize alcohol and caffeine. Eat small frequent meals with lean proteins and complex carbs. Avoid high and low blood sugars. Get adequate sleep, 7-8 hours a night. Needs exercise daily preferably in the morning.  Rx- Ibuprofen  800 mg q 8hr prn for HA Orders as above. Follow up if no improvement The patient voiced understanding and agreement to the plan.  Harlene LITTIE Jolly, DNP, AGNP-C 4:19 PM 07/07/2024

## 2024-07-09 ENCOUNTER — Encounter: Payer: Self-pay | Admitting: Family Medicine

## 2024-07-28 ENCOUNTER — Encounter: Payer: Self-pay | Admitting: Family Medicine

## 2024-07-28 ENCOUNTER — Ambulatory Visit (INDEPENDENT_AMBULATORY_CARE_PROVIDER_SITE_OTHER): Payer: Self-pay | Admitting: Family Medicine

## 2024-07-28 VITALS — BP 126/82 | HR 82 | Temp 98.0°F | Resp 16 | Ht 63.0 in | Wt 134.0 lb

## 2024-07-28 DIAGNOSIS — Z789 Other specified health status: Secondary | ICD-10-CM

## 2024-07-28 DIAGNOSIS — G8929 Other chronic pain: Secondary | ICD-10-CM

## 2024-07-28 DIAGNOSIS — E559 Vitamin D deficiency, unspecified: Secondary | ICD-10-CM

## 2024-07-28 DIAGNOSIS — R519 Headache, unspecified: Secondary | ICD-10-CM

## 2024-07-28 DIAGNOSIS — Z0000 Encounter for general adult medical examination without abnormal findings: Secondary | ICD-10-CM

## 2024-07-28 MED ORDER — GABAPENTIN 300 MG PO CAPS: 300.0000 mg | ORAL_CAPSULE | Freq: Three times a day (TID) | ORAL | 3 refills | Status: AC

## 2024-07-28 MED ORDER — TRIAMCINOLONE ACETONIDE 0.1 % EX CREA: 1.0000 | TOPICAL_CREAM | Freq: Two times a day (BID) | CUTANEOUS | 0 refills | Status: AC

## 2024-07-28 NOTE — Progress Notes (Signed)
 Chief Complaint  Patient presents with   Annual Exam    CPE    Well Male Paul Cabrera is here for a complete physical. Here w friend who helps interpret ([place]) His last physical was >1 year ago.  Current diet: in general, a healthy diet.   Current exercise: tennis during summertime Weight trend: stable Fatigue out of ordinary? No. Seat belt? Yes.   Advanced directive? No  Health maintenance Tetanus- Yes HIV- Yes Hep C- Yes  Patient has a history of chronic daily headache.  Almost 3 weeks ago, he had an exacerbation.  He was referred to neurology but cannot get in for another 4 months.  He was on gabapentin  300 mg 3 times daily in the past which was helpful.  Requesting a refill.  No other neurologic signs or symptoms.  Did not take the Imitrex .  Past Medical History:  Diagnosis Date   GERD (gastroesophageal reflux disease)      Past Surgical History:  Procedure Laterality Date   NO PAST SURGERIES      Medications  Medications Ordered Prior to Encounter[1]   Allergies Allergies[2]  Family History Family History  Problem Relation Age of Onset   Cancer Neg Hx     Review of Systems: Constitutional: no fevers or chills Eye:  no recent significant change in vision Ear/Nose/Mouth/Throat:  Ears:  no hearing loss Nose/Mouth/Throat:  no complaints of nasal congestion, no sore throat Cardiovascular:  no chest pain Respiratory:  no shortness of breath Gastrointestinal:  no abdominal pain, no change in bowel habits GU:  Male: negative for dysuria, frequency, and incontinence Musculoskeletal/Extremities:  no pain of the joints Integumentary (Skin/Breast):  no abnormal skin lesions reported Neurologic:  no headaches Endocrine: No unexpected weight changes Hematologic/Lymphatic:  no night sweats  Exam BP 126/82 (BP Location: Left Arm, Patient Position: Sitting)   Pulse 82   Temp 98 F (36.7 C) (Oral)   Resp 16   Ht 5' 3 (1.6 m)   Wt 134 lb (60.8 kg)   SpO2  96%   BMI 23.74 kg/m  General:  well developed, well nourished, in no apparent distress Skin:  no significant moles, warts, or growths Head:  no masses, lesions, or tenderness Eyes:  pupils equal and round, sclera anicteric without injection Ears:  canals without lesions, TMs shiny without retraction, no obvious effusion, no erythema Nose:  nares patent, mucosa normal Throat/Pharynx:  lips and gingiva without lesion; tongue and uvula midline; non-inflamed pharynx; no exudates or postnasal drainage Neck: neck supple without adenopathy, thyromegaly, or masses Lungs:  clear to auscultation, breath sounds equal bilaterally, no respiratory distress Cardio:  regular rate and rhythm, no bruits, no LE edema Abdomen:  abdomen soft, nontender; bowel sounds normal; no masses or organomegaly Rectal: Deferred Musculoskeletal:  symmetrical muscle groups noted without atrophy or deformity Extremities:  no clubbing, cyanosis, or edema, no deformities, no skin discoloration Neuro:  gait normal; deep tendon reflexes normal and symmetric Psych: well oriented with normal range of affect and appropriate judgment/insight  Assessment and Plan  Well adult exam - Plan: CBC, Comprehensive metabolic panel with GFR, Hepatitis B surface antibody,quantitative, Lipid panel  Vitamin D  deficiency - Plan: VITAMIN D  25 Hydroxy (Vit-D Deficiency, Fractures)  Vegetarianism - Plan: B12  Chronic nonintractable headache, unspecified headache type - Plan: MR Brain Wo Contrast   Well 44 y.o. male. Counseled on diet and exercise. Screen Hep B.  Advanced directive form provided today.  Neuro consult pending, MRI as above. Restart gabapentin   300 mg TID. Tolerated well in the past. Ok to cont NSAIDs and Imitrex  prn. Cont PO Mg.  Other orders as above. Follow up in 1 yr pending the above workup. The patient thru his friend voiced understanding and agreement to the plan.  [name], DO 07/28/24 3:08 PM      [1]  Current Outpatient Medications on File Prior to Visit  Medication Sig Dispense Refill   SUMAtriptan  (IMITREX ) 100 MG tablet Take 1 tablet (100 mg total) by mouth every 2 (two) hours as needed for migraine. May repeat in 2 hours if headache persists or recurs. Do not exceed 2 tabs daily. 10 tablet 0   No current facility-administered medications on file prior to visit.  [2] No Known Allergies

## 2024-07-28 NOTE — Patient Instructions (Addendum)
 Someone will reach out regarding your MRI.  Consider daily magnesium 200-500 mg daily in addition to the gabapentin .  Give us  2-3 business days to get the results of your labs back.   Keep the diet clean and stay active.  Aim to do some physical exertion for 150 minutes per week. This is typically divided into 5 days per week, 30 minutes per day. The activity should be enough to get your heart rate up. Anything is better than nothing if you have time constraints.  Please get me a copy of your advanced directive form at your convenience.   Let us  know if you need anything.

## 2024-07-29 ENCOUNTER — Ambulatory Visit: Payer: Self-pay | Admitting: Family Medicine

## 2024-07-29 DIAGNOSIS — E781 Pure hyperglyceridemia: Secondary | ICD-10-CM

## 2024-07-29 DIAGNOSIS — R7309 Other abnormal glucose: Secondary | ICD-10-CM

## 2024-07-29 LAB — COMPREHENSIVE METABOLIC PANEL WITH GFR
AG Ratio: 1.7 (calc) (ref 1.0–2.5)
ALT: 15 U/L (ref 9–46)
AST: 15 U/L (ref 10–40)
Albumin: 4.5 g/dL (ref 3.6–5.1)
Alkaline phosphatase (APISO): 41 U/L (ref 36–130)
BUN: 14 mg/dL (ref 7–25)
CO2: 27 mmol/L (ref 20–32)
Calcium: 9.1 mg/dL (ref 8.6–10.3)
Chloride: 101 mmol/L (ref 98–110)
Creat: 0.83 mg/dL (ref 0.60–1.29)
Globulin: 2.6 g/dL (ref 1.9–3.7)
Glucose, Bld: 131 mg/dL — ABNORMAL HIGH (ref 65–99)
Potassium: 4.1 mmol/L (ref 3.5–5.3)
Sodium: 141 mmol/L (ref 135–146)
Total Bilirubin: 0.7 mg/dL (ref 0.2–1.2)
Total Protein: 7.1 g/dL (ref 6.1–8.1)
eGFR: 111 mL/min/{1.73_m2}

## 2024-07-29 LAB — CBC
HCT: 46.7 % (ref 39.4–51.1)
Hemoglobin: 15.9 g/dL (ref 13.2–17.1)
MCH: 31.9 pg (ref 27.0–33.0)
MCHC: 34 g/dL (ref 31.6–35.4)
MCV: 93.8 fL (ref 81.4–101.7)
MPV: 10 fL (ref 7.5–12.5)
Platelets: 263 10*3/uL (ref 140–400)
RBC: 4.98 Million/uL (ref 4.20–5.80)
RDW: 12 % (ref 11.0–15.0)
WBC: 6.2 10*3/uL (ref 3.8–10.8)

## 2024-07-29 LAB — VITAMIN B12: Vitamin B-12: 311 pg/mL (ref 200–1100)

## 2024-07-29 LAB — VITAMIN D 25 HYDROXY (VIT D DEFICIENCY, FRACTURES): Vit D, 25-Hydroxy: 28 ng/mL — ABNORMAL LOW (ref 30–100)

## 2024-07-29 LAB — LIPID PANEL
Cholesterol: 223 mg/dL — ABNORMAL HIGH
HDL: 42 mg/dL
Non-HDL Cholesterol (Calc): 181 mg/dL — ABNORMAL HIGH
Total CHOL/HDL Ratio: 5.3 (calc) — ABNORMAL HIGH
Triglycerides: 577 mg/dL — ABNORMAL HIGH

## 2024-07-29 LAB — HEPATITIS B SURFACE ANTIBODY, QUANTITATIVE: Hep B S AB Quant (Post): 322 m[IU]/mL

## 2024-07-31 ENCOUNTER — Encounter: Payer: Self-pay | Admitting: Family Medicine

## 2024-08-01 ENCOUNTER — Other Ambulatory Visit: Payer: Self-pay

## 2024-08-01 ENCOUNTER — Other Ambulatory Visit: Payer: Self-pay | Admitting: Family Medicine

## 2024-08-01 MED ORDER — ROSUVASTATIN CALCIUM 20 MG PO TABS: 20.0000 mg | ORAL_TABLET | Freq: Every day | ORAL | 3 refills | Status: AC

## 2024-11-24 ENCOUNTER — Ambulatory Visit: Admitting: Diagnostic Neuroimaging
